# Patient Record
Sex: Male | Born: 1965 | Race: White | Hispanic: No | Marital: Married | Smoking: Never smoker
Health system: Southern US, Community
[De-identification: ages and names within clinical notes are randomized; demographics above are authoritative.]

## PROBLEM LIST (undated history)

## (undated) DIAGNOSIS — I2699 Other pulmonary embolism without acute cor pulmonale: Secondary | ICD-10-CM

## (undated) DIAGNOSIS — I10 Essential (primary) hypertension: Secondary | ICD-10-CM

## (undated) DIAGNOSIS — I82409 Acute embolism and thrombosis of unspecified deep veins of unspecified lower extremity: Secondary | ICD-10-CM

## (undated) HISTORY — PX: BACK SURGERY: SHX140

---

## 2003-07-30 ENCOUNTER — Encounter: Payer: Self-pay | Admitting: Specialist

## 2003-07-30 ENCOUNTER — Encounter: Admission: RE | Admit: 2003-07-30 | Discharge: 2003-07-30 | Payer: Self-pay | Admitting: Specialist

## 2003-08-21 ENCOUNTER — Encounter: Payer: Self-pay | Admitting: Specialist

## 2003-08-22 ENCOUNTER — Inpatient Hospital Stay (HOSPITAL_COMMUNITY): Admission: RE | Admit: 2003-08-22 | Discharge: 2003-08-24 | Payer: Self-pay | Admitting: Specialist

## 2015-11-05 ENCOUNTER — Telehealth: Payer: Self-pay | Admitting: Internal Medicine

## 2015-11-05 ENCOUNTER — Inpatient Hospital Stay (HOSPITAL_COMMUNITY)
Admission: AD | Admit: 2015-11-05 | Discharge: 2015-11-10 | DRG: 176 | Disposition: A | Discharge status: Home / Self Care | Payer: PRIVATE HEALTH INSURANCE | Source: Other Acute Inpatient Hospital | Attending: Internal Medicine | Admitting: Internal Medicine

## 2015-11-05 ENCOUNTER — Encounter (HOSPITAL_COMMUNITY): Payer: Self-pay

## 2015-11-05 DIAGNOSIS — Z6834 Body mass index (BMI) 34.0-34.9, adult: Secondary | ICD-10-CM | POA: Diagnosis not present

## 2015-11-05 DIAGNOSIS — I10 Essential (primary) hypertension: Secondary | ICD-10-CM | POA: Diagnosis present

## 2015-11-05 DIAGNOSIS — I82402 Acute embolism and thrombosis of unspecified deep veins of left lower extremity: Secondary | ICD-10-CM | POA: Diagnosis present

## 2015-11-05 DIAGNOSIS — R55 Syncope and collapse: Secondary | ICD-10-CM | POA: Diagnosis present

## 2015-11-05 DIAGNOSIS — I5189 Other ill-defined heart diseases: Secondary | ICD-10-CM | POA: Diagnosis present

## 2015-11-05 DIAGNOSIS — I824Z2 Acute embolism and thrombosis of unspecified deep veins of left distal lower extremity: Secondary | ICD-10-CM | POA: Diagnosis not present

## 2015-11-05 DIAGNOSIS — R079 Chest pain, unspecified: Secondary | ICD-10-CM

## 2015-11-05 DIAGNOSIS — R000 Tachycardia, unspecified: Secondary | ICD-10-CM | POA: Diagnosis present

## 2015-11-05 DIAGNOSIS — I82539 Chronic embolism and thrombosis of unspecified popliteal vein: Secondary | ICD-10-CM

## 2015-11-05 DIAGNOSIS — I2699 Other pulmonary embolism without acute cor pulmonale: Secondary | ICD-10-CM | POA: Diagnosis present

## 2015-11-05 DIAGNOSIS — Z86711 Personal history of pulmonary embolism: Secondary | ICD-10-CM | POA: Diagnosis not present

## 2015-11-05 DIAGNOSIS — Z8249 Family history of ischemic heart disease and other diseases of the circulatory system: Secondary | ICD-10-CM | POA: Diagnosis not present

## 2015-11-05 DIAGNOSIS — E669 Obesity, unspecified: Secondary | ICD-10-CM | POA: Diagnosis present

## 2015-11-05 DIAGNOSIS — I2609 Other pulmonary embolism with acute cor pulmonale: Secondary | ICD-10-CM | POA: Diagnosis not present

## 2015-11-05 DIAGNOSIS — I82409 Acute embolism and thrombosis of unspecified deep veins of unspecified lower extremity: Secondary | ICD-10-CM | POA: Diagnosis present

## 2015-11-05 HISTORY — DX: Essential (primary) hypertension: I10

## 2015-11-05 LAB — CBC WITH DIFFERENTIAL/PLATELET
Basophils Absolute: 0.1 10*3/uL (ref 0.0–0.1)
Basophils Relative: 1 %
EOS PCT: 1 %
Eosinophils Absolute: 0.1 10*3/uL (ref 0.0–0.7)
HEMATOCRIT: 43.9 % (ref 39.0–52.0)
HEMOGLOBIN: 15.2 g/dL (ref 13.0–17.0)
LYMPHS ABS: 3.1 10*3/uL (ref 0.7–4.0)
LYMPHS PCT: 34 %
MCH: 31 pg (ref 26.0–34.0)
MCHC: 34.6 g/dL (ref 30.0–36.0)
MCV: 89.4 fL (ref 78.0–100.0)
Monocytes Absolute: 0.5 10*3/uL (ref 0.1–1.0)
Monocytes Relative: 5 %
NEUTROS ABS: 5.5 10*3/uL (ref 1.7–7.7)
Neutrophils Relative %: 59 %
Platelets: 142 10*3/uL — ABNORMAL LOW (ref 150–400)
RBC: 4.91 MIL/uL (ref 4.22–5.81)
RDW: 12.2 % (ref 11.5–15.5)
WBC: 9.3 10*3/uL (ref 4.0–10.5)

## 2015-11-05 LAB — COMPREHENSIVE METABOLIC PANEL
ALT: 58 U/L (ref 17–63)
AST: 38 U/L (ref 15–41)
Albumin: 3.5 g/dL (ref 3.5–5.0)
Alkaline Phosphatase: 75 U/L (ref 38–126)
Anion gap: 9 (ref 5–15)
BUN: 17 mg/dL (ref 6–20)
CHLORIDE: 105 mmol/L (ref 101–111)
CO2: 23 mmol/L (ref 22–32)
CREATININE: 1.2 mg/dL (ref 0.61–1.24)
Calcium: 8.4 mg/dL — ABNORMAL LOW (ref 8.9–10.3)
GFR calc non Af Amer: >60 mL/min (ref >60)
Glucose, Bld: 127 mg/dL — ABNORMAL HIGH (ref 65–99)
Potassium: 4.4 mmol/L (ref 3.5–5.1)
SODIUM: 137 mmol/L (ref 135–145)
Total Bilirubin: 1.1 mg/dL (ref 0.3–1.2)
Total Protein: 6.3 g/dL — ABNORMAL LOW (ref 6.5–8.1)

## 2015-11-05 LAB — TROPONIN I: Troponin I: 0.39 ng/mL — ABNORMAL HIGH (ref <0.031)

## 2015-11-05 LAB — HEPARIN LEVEL (UNFRACTIONATED): HEPARIN UNFRACTIONATED: 0.38 [IU]/mL (ref 0.30–0.70)

## 2015-11-05 LAB — MRSA PCR SCREENING: MRSA by PCR: NEGATIVE

## 2015-11-05 LAB — MAGNESIUM: Magnesium: 2.1 mg/dL (ref 1.7–2.4)

## 2015-11-05 LAB — TSH: TSH: 1.036 u[IU]/mL (ref 0.350–4.500)

## 2015-11-05 LAB — LACTIC ACID, PLASMA: Lactic Acid, Venous: 1.4 mmol/L (ref 0.5–2.0)

## 2015-11-05 MED ORDER — HYDRALAZINE HCL 20 MG/ML IJ SOLN: 5.0000 mg | INTRAMUSCULAR | Status: DC | PRN

## 2015-11-05 MED ORDER — HEPARIN (PORCINE) IN NACL 100-0.45 UNIT/ML-% IJ SOLN
1800.0000 [IU]/h | INTRAMUSCULAR | Status: DC
  Administered 2015-11-06: 1800 [IU]/h via INTRAVENOUS
  Administered 2015-11-06: 1600 [IU]/h via INTRAVENOUS
  Administered 2015-11-07: 1800 [IU]/h via INTRAVENOUS
  Filled 2015-11-05 (×3): qty 250

## 2015-11-05 MED ORDER — SODIUM CHLORIDE 0.9 % IJ SOLN
3.0000 mL | Freq: Two times a day (BID) | INTRAMUSCULAR | Status: DC
  Administered 2015-11-05 – 2015-11-08 (×5): 3 mL via INTRAVENOUS

## 2015-11-05 MED ORDER — ONDANSETRON HCL 4 MG PO TABS: 4.0000 mg | ORAL_TABLET | Freq: Four times a day (QID) | ORAL | Status: DC | PRN

## 2015-11-05 MED ORDER — CETYLPYRIDINIUM CHLORIDE 0.05 % MT LIQD
7.0000 mL | Freq: Two times a day (BID) | OROMUCOSAL | Status: DC
  Administered 2015-11-05 – 2015-11-10 (×7): 7 mL via OROMUCOSAL

## 2015-11-05 MED ORDER — ACETAMINOPHEN 325 MG PO TABS: 650.0000 mg | ORAL_TABLET | Freq: Four times a day (QID) | ORAL | Status: DC | PRN

## 2015-11-05 MED ORDER — ONDANSETRON HCL 4 MG/2ML IJ SOLN: 4.0000 mg | Freq: Four times a day (QID) | INTRAMUSCULAR | Status: DC | PRN

## 2015-11-05 MED ORDER — ENSURE ENLIVE PO LIQD
237.0000 mL | Freq: Two times a day (BID) | ORAL | Status: DC
  Administered 2015-11-06 – 2015-11-08 (×5): 237 mL via ORAL

## 2015-11-05 MED ORDER — SODIUM CHLORIDE 0.9 % IJ SOLN
3.0000 mL | Freq: Two times a day (BID) | INTRAMUSCULAR | Status: DC
  Administered 2015-11-06 – 2015-11-08 (×4): 3 mL via INTRAVENOUS

## 2015-11-05 MED ORDER — ACETAMINOPHEN 650 MG RE SUPP: 650.0000 mg | Freq: Four times a day (QID) | RECTAL | Status: DC | PRN

## 2015-11-05 MED ORDER — OXYCODONE HCL 5 MG PO TABS
5.0000 mg | ORAL_TABLET | ORAL | Status: DC | PRN
  Administered 2015-11-06 – 2015-11-07 (×2): 5 mg via ORAL
  Filled 2015-11-05 (×2): qty 1

## 2015-11-05 MED ORDER — SODIUM CHLORIDE 0.45 % IV SOLN
INTRAVENOUS | Status: DC
  Administered 2015-11-05 – 2015-11-08 (×6): via INTRAVENOUS

## 2015-11-05 NOTE — Telephone Encounter (Signed)
[facility] Plan of Care Note  Patient: Randy GlennBrian A Papania    XLK:[id number]  PCP: No primary care provider on file.  DOB: September 12, 1966  DOS: 11/05/2015   Received a phone call from Facility[facility] regarding transfer of Mr. Randy GlennBrian A Nolte. Requesting: Dr. Littie Deedsgentry Reason for transfer: Critical care support History: No past medical history, presents with complains of cough shortness of breath and an episode of syncope. Vitals: Blood pressure 130/70, heart rate 112, 97% on 2 L, 92% on room air, afebrile Labs: Hemoglobin 16, creatinine 1.2 Exam: No edema, unremarkable examination, CT chest shows soft massive PE with right heart strain, troponin 0.19 indeterminate Treatment received: Heparin infusion started. Initially discussed with critical care, and recommendation from them was to [facility] under hospitalist care for stepdown.  Plan of care: The patient will be accepted [facility].  Author: Lynden OxfordPranav Patel, MD [facility] Pager: [phone] 11/05/2015 1:23 PM   If 7PM-7AM, please contact night-coverage at [email or web address], password [email or web address]

## 2015-11-05 NOTE — H&P (Signed)
[facility] History and Physical  Randy [facility] [id number]:[id number] DOB: 1966/06/07 DOA: 11/05/2015  Referring physician: [facility] PCP: No primary care provider on file.   Chief Complaint: PE and syncope  HPI: Randy Hutchinson is a 50 y.o. male  Patient's initial presenting complaint today came after a syncopal episode with progressive shortness of breath. Per report patient has had intermittent shortness of breath since the end of November. This is associated with left lower leg and ankle cramping. Symptoms were significantly worse with ambulation. Patient states that he will get winded walking to his truck which normally doesn't happen. Patient states at baseline he is in excellent health and somewhat athletic. Shortness of breath became very constant over the last couple of weeks. Patient was seen multiple times by different providers who diagnosed him with upper respiratory tract infections he completed 2 separate rounds of antibiotics as well as received 2 separate steroid shots without any significant improvement of symptoms. Patient had a single episode of hemoptysis a couple of days ago. Patient has had significant worsening of her shortness of breath over the last 4 days. Denies any lower extremity swelling. Patient states that he woke this morning in order to go to work and ambulate to the bathroom. He states that the next he remembers he is on the bathroom floor feeling completely confused and then passed out again lying on the floor. Sometime later he woke up again and started banging on the wall at which time his son came in and found him. EMS was called and patient transported to [facility]. Associated w/ mild chest discomfort.   Review of Systems:  Constitutional:  No weight loss, night sweats, Fevers,  HEENT:  No headaches, Difficulty swallowing,Tooth/dental problems,Sore throat, Cardio-vascular:  No Orthopnea, PND, swelling in lower extremities, GI:    No heartburn, indigestion, abdominal pain, nausea, vomiting, diarrhea, change in bowel habits, loss of appetite  Resp: Per HPI Skin:  no rash or lesions.  GU:  no dysuria, change in color of urine, no urgency or frequency. No flank pain.  Musculoskeletal:   No joint pain or swelling. No decreased range of motion. No back pain.  Psych:  No change in mood or affect. No depression or anxiety. No memory loss.  Neuro:  No change in sensation, unilateral strength, or cognitive abilities  All other systems were reviewed and are negative.  Past Medical History  Diagnosis Date  . Essential hypertension 11/05/2015   Past Surgical History  Procedure Laterality Date  . Back surgery      trauma sustained from fall   Social History:  reports that he has never smoked. He does not have any smokeless tobacco history on file. He reports that he drinks alcohol. He reports that he does not use illicit drugs.  No Known Allergies  Family History  Problem Relation Age of Onset  . Cancer Father     lymphoma  . Heart attack Paternal Grandfather      Prior to Admission medications   Not on File   Physical Exam: Filed Vitals:   11/05/15 1600  BP: 148/89  Pulse: 119  Temp: 99.3 F (37.4 C)  TempSrc: Oral  Resp: 20  Height: 6\' 4"  (1.93 m)  Weight: 121.4 kg (267 lb 10.2 oz)  SpO2: 96%    Wt Readings from Last 3 Encounters:  11/05/15 121.4 kg (267 lb 10.2 oz)    General: Appears to be in mild distress Eyes:  PERRL, EOMI, normal lids,  iris ENT:  grossly normal hearing, lips & tongue Neck:  no LAD, masses or thyromegaly Cardiovascular:  RRR, no m/r/g. No LE edema.  Respiratory:  CTA bilaterally, no w/r/r. Normal respiratory effort. Abdomen:  soft, ntnd Skin:  no rash or induration seen on limited exam Musculoskeletal:  grossly normal tone BUE/BLE Psychiatric:  grossly normal mood and affect, speech fluent and appropriate Neurologic:  CN 2-12 grossly intact, moves all extremities in  coordinated fashion.          Labs on Admission:  Basic Metabolic Panel: No results for input(s): NA, K, CL, CO2, GLUCOSE, BUN, CREATININE, CALCIUM, MG, PHOS in the last 168 hours. Liver Function Tests: No results for input(s): AST, ALT, ALKPHOS, BILITOT, PROT, ALBUMIN in the last 168 hours. No results for input(s): LIPASE, AMYLASE in the last 168 hours. No results for input(s): AMMONIA in the last 168 hours. CBC: No results for input(s): WBC, NEUTROABS, HGB, HCT, MCV, PLT in the last 168 hours. Cardiac Enzymes: No results for input(s): CKTOTAL, CKMB, CKMBINDEX, TROPONINI in the last 168 hours.  BNP (last 3 results) No results for input(s): BNP in the last 8760 hours.  ProBNP (last 3 results) No results for input(s): PROBNP in the last 8760 hours.   Creatinine clearance cannot be calculated (No order found.)  CBG: No results for input(s): GLUCAP in the last 168 hours.  Radiological Exams on Admission: No results found.   Assessment/Plan Active Problems:   Acute pulmonary embolism (HCC)   DVT (deep venous thrombosis) (HCC)   Syncope   Essential hypertension   Tachycardia   Pulmonary embolism [facility])  Medical records from [facility] unavailable at time of admission but have been requested to be faxed to [facility] at [phone].  Pulmonary Embolism: Per report patient sustained a massive pulmonary embolus. Suspect from patient's history he likely had multiple small emboli before mobilizing a large thrombus this morning lying related to the bathroom. Patient currently is stable. Suspect this came from his left lower extremity. We'll not re-CTA patient has had the study done this morning but will review records when they arrive from [facility]. 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  - Stepdown - Hep drip - Review [facility] records - Bilat LE dopplers - hypercoag workup after off  anticoagulation in 3-6 mo. - Echo - EKG   Syncope: likely secondary to massive PE. No evidence of stroke or Szr, or metabolic, or infectious etiology. Report pending. Per pt CT of head negative. Low likelihood of ICP. - Tele - CMET, CBC, lactic acid  Hypertension: hypertensive on presentation. Likely from PE - Hydralazine PRN   Code Status: FULL  DVT Prophylaxis: Heparin Drip Family Communication: None Disposition Plan: Pending Improvement    MERRELL, DAVID Shela Commons, MD Family Medicine [facility] [email or web address] Password [email or web address]

## 2015-11-05 NOTE — Progress Notes (Addendum)
ANTICOAGULATION CONSULT NOTE - Initial Consult  Pharmacy Consult for Heparin Indication: pulmonary embolus  No Known Allergies  Patient Measurements: Height: 6\' 4"  (193 cm) Weight: 267 lb 10.2 oz (121.4 kg) IBW/kg (Calculated) : 86.8 Heparin Dosing Weight: 112kg  Vital Signs: Temp: 99.3 F (37.4 C) (01/04 1600) Temp Source: Oral (01/04 1600) BP: 148/89 mmHg (01/04 1600) Pulse Rate: 119 (01/04 1600)  Labs: No results for input(s): HGB, HCT, PLT, APTT, LABPROT, INR, HEPARINUNFRC, CREATININE, CKTOTAL, CKMB, TROPONINI in the last 72 hours.  CrCl cannot be calculated (Patient has no serum creatinine result on file.).   Medical History: Past Medical History  Diagnosis Date  . Essential hypertension 11/05/2015    Assessment: 50 year old male presents with syncopal episode with progressive shortness of breath. Single episode of hemoptysis two days ago.  [facility], bolus unknown but rate is currently at 1600 units/hr started around 2pm. CTA shows at least submassive PE.  Notable labs, hgb 16, plt 123, scr 1.2, INR 1.1   Goal of Therapy:  Heparin level 0.3-0.7 units/ml Monitor platelets by anticoagulation protocol: Yes   Plan:  Continue heparin at 1600 units/hr Check HL at 8pm Daily HL/CBC  Sheppard CoilFrank Wilson PharmD., BCPS Clinical Pharmacist Pager [phone] 11/05/2015 6:40 PM  HL at goal 0.38 on 1600 units/hr. Plan to recheck HL in am  11/05/2015 8:59 PM

## 2015-11-06 ENCOUNTER — Inpatient Hospital Stay (HOSPITAL_COMMUNITY): Payer: PRIVATE HEALTH INSURANCE

## 2015-11-06 DIAGNOSIS — Z86711 Personal history of pulmonary embolism: Secondary | ICD-10-CM

## 2015-11-06 DIAGNOSIS — I2699 Other pulmonary embolism without acute cor pulmonale: Secondary | ICD-10-CM

## 2015-11-06 LAB — COMPREHENSIVE METABOLIC PANEL
ALBUMIN: 2.9 g/dL — AB (ref 3.5–5.0)
ALK PHOS: 66 U/L (ref 38–126)
ALT: 50 U/L (ref 17–63)
AST: 30 U/L (ref 15–41)
Anion gap: 6 (ref 5–15)
BUN: 16 mg/dL (ref 6–20)
CALCIUM: 8.2 mg/dL — AB (ref 8.9–10.3)
CO2: 25 mmol/L (ref 22–32)
CREATININE: 1.1 mg/dL (ref 0.61–1.24)
Chloride: 105 mmol/L (ref 101–111)
GFR calc Af Amer: >60 mL/min (ref >60)
GFR calc non Af Amer: >60 mL/min (ref >60)
GLUCOSE: 103 mg/dL — AB (ref 65–99)
Potassium: 4 mmol/L (ref 3.5–5.1)
SODIUM: 136 mmol/L (ref 135–145)
Total Bilirubin: 1.2 mg/dL (ref 0.3–1.2)
Total Protein: 5.6 g/dL — ABNORMAL LOW (ref 6.5–8.1)

## 2015-11-06 LAB — CBC
HEMATOCRIT: 40.5 % (ref 39.0–52.0)
Hemoglobin: 14.2 g/dL (ref 13.0–17.0)
MCH: 31.8 pg (ref 26.0–34.0)
MCHC: 35.1 g/dL (ref 30.0–36.0)
MCV: 90.8 fL (ref 78.0–100.0)
Platelets: 128 10*3/uL — ABNORMAL LOW (ref 150–400)
RBC: 4.46 MIL/uL (ref 4.22–5.81)
RDW: 12.2 % (ref 11.5–15.5)
WBC: 9.9 10*3/uL (ref 4.0–10.5)

## 2015-11-06 LAB — TROPONIN I
Troponin I: 0.33 ng/mL — ABNORMAL HIGH (ref <0.031)
Troponin I: 0.17 ng/mL — ABNORMAL HIGH (ref <0.031)

## 2015-11-06 LAB — HEPARIN LEVEL (UNFRACTIONATED)
HEPARIN UNFRACTIONATED: 0.2 [IU]/mL — AB (ref 0.30–0.70)
HEPARIN UNFRACTIONATED: 0.49 [IU]/mL (ref 0.30–0.70)

## 2015-11-06 LAB — GLUCOSE, CAPILLARY: GLUCOSE-CAPILLARY: 91 mg/dL (ref 65–99)

## 2015-11-06 MED ORDER — HEPARIN BOLUS VIA INFUSION
2000.0000 [IU] | Freq: Once | INTRAVENOUS | Status: AC
  Administered 2015-11-06: 2000 [IU] via INTRAVENOUS
  Filled 2015-11-06: qty 2000

## 2015-11-06 MED ORDER — PERFLUTREN LIPID MICROSPHERE
1.0000 mL | INTRAVENOUS | Status: AC | PRN
  Filled 2015-11-06: qty 10

## 2015-11-06 NOTE — Progress Notes (Signed)
Dr Joseph ArtWoods made aware of positive DVT Right lower extremity, patient on heparin drip.  Corliss SkainsJuan Eleyna Brugh RN

## 2015-11-06 NOTE — Progress Notes (Signed)
[place] TEAM 1 - Stepdown/ICU TEAM Progress Note  Randy Hutchinson ZOX:[id number] DOB: 1966/07/25 DOA: 11/05/2015 PCP: No primary care provider on file.  Admit HPI / Brief Narrative: 50 y.o. WM PMHx HTN   Patient's initial presenting complaint today came after a syncopal episode with progressive shortness of breath. Per report patient has had intermittent shortness of breath since the end of November. This is associated with left lower leg and ankle cramping. Symptoms were significantly worse with ambulation. Patient states that he will get winded walking to his truck which normally doesn't happen. Patient states at baseline he is in excellent health and somewhat athletic. Shortness of breath became very constant over the last couple of weeks. Patient was seen multiple times by different providers who diagnosed him with upper respiratory tract infections he completed 2 separate rounds of antibiotics as well as received 2 separate steroid shots without any significant improvement of symptoms. Patient had a single episode of hemoptysis a couple of days ago. Patient has had significant worsening of her shortness of breath over the last 4 days. Denies any lower extremity swelling. Patient states that he woke this morning in order to go to work and ambulate to the bathroom. He states that the next he remembers he is on the bathroom floor feeling completely confused and then passed out again lying on the floor. Sometime later he woke up again and started banging on the wall at which time his son came in and found him. EMS was called and patient transported to [facility]. Associated w/ mild chest discomfort.    HPI/Subjective: 1/5 A/O 4, positive painful cough. States had been having what he thought was bronchitis for several weeks. Also was having left calf pain but associated with his long drives for work during the day. 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 No family members with PE/DVT    Assessment/Plan: Pulmonary Embolism/Left Lower Extremity DVT: - Per report patient sustained a massive pulmonary embolus. Suspect from patient's history he likely had multiple small emboli before mobilizing a large thrombus this morning lying related to the bathroom. Patient currently is stable. Suspect this came from his left lower extremity. We'll not re-CTA patient has had the study done this morning but will review records when they arrive [facility]. 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  - Hep drip -Awaiting [facility] hospital records - Bilat LE dopplers; positive LLE DVT C results below - hypercoag workup after off anticoagulation in 3-6 mo. -Long discussion with patient and wife concerning choice of anticoagulant. 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 Patient and wife to talk it over  Right heart strain/Diastolic Dysfunction -See echocardiogram below  Syncope:  -likely secondary to massive PE. No evidence of stroke or Szr, or metabolic, or infectious etiology. Report pending. Per pt CT of head negative. Low likelihood of ICP.  Hypertension:  -Currently controlled: Continue  Hydralazine PRN    Code Status: FULL Family Communication: Wife present at time of exam Disposition Plan: After patient chooses anticoagulation modality    Consultants:   Procedure/Significant Events: 1/5 echocardiogram;- Left ventricle: mild LVH. -LVEF= 55% to 60%.-(grade 1 diastolic dysfunction). - septalflattening suggestive of elevated pulmonary pressure - Right ventricle: moderate RV dysfunction.. 1/5 bilateral lower extremity Doppler; subacute DVT noted in the left popliteal  and posterior tibial veins   Culture NA  Antibiotics: NA  DVT prophylaxis: Heparin  drip   Devices NA   LINES / TUBES:  NA    Continuous Infusions: . sodium chloride 75 mL/hr at 11/06/15 0713  . heparin 1,800 Units/hr (11/06/15 1848)    Objective: VITAL SIGNS: Temp: 97.3 F (36.3 C) (01/05 1600) Temp Source: Oral (01/05 1600) BP: 132/102 mmHg (01/05 1900) Pulse Rate: 84 (01/05 1900) SPO2; FIO2:   Intake/Output Summary (Last 24 hours) at 11/06/15 1933 Last data filed at 11/06/15 1900  Gross per 24 hour  Intake 3127.6 ml  Output   1700 ml  Net 1427.6 ml     Exam: General: A/O 4, positive painful cough,No acute respiratory distress Eyes: Negative headache, eye pain, double vision,negative scleral hemorrhage ENT: Negative Runny nose, negative ear pain, negative gingival bleeding, Neck:  Negative scars, masses, torticollis, lymphadenopathy, JVD Lungs: Clear to auscultation bilaterally without wheezes or crackles Cardiovascular: Regular rate and rhythm without murmur gallop or rub normal S1 and S2 Abdomen:negative abdominal pain, nondistended, positive soft, bowel sounds, no rebound, no ascites, no appreciable mass Extremities: No significant cyanosis, clubbing, or edema bilateral lower extremities Psychiatric:  Negative depression, negative anxiety, negative fatigue, negative mania  Neurologic:  Cranial nerves II through XII intact, tongue/uvula midline, all extremities muscle strength 5/5, sensation intact throughout, negative dysarthria, negative expressive aphasia, negative receptive aphasia.   Data Reviewed: Basic Metabolic Panel:  Recent Labs Lab 11/05/15 2012 11/06/15 0810  NA 137 136  K 4.4 4.0  CL 105 105  CO2 23 25  GLUCOSE 127* 103*  BUN 17 16  CREATININE 1.20 1.10  CALCIUM 8.4* 8.2*  MG 2.1  --    Liver Function Tests:  Recent Labs Lab 11/05/15 2012 11/06/15 0810  AST 38 30  ALT 58 50  ALKPHOS 75 66  BILITOT 1.1 1.2  PROT 6.3* 5.6*  ALBUMIN 3.5 2.9*   No results for input(s): LIPASE, AMYLASE in the last 168  hours. No results for input(s): AMMONIA in the last 168 hours. CBC:  Recent Labs Lab 11/05/15 2012 11/06/15 0536  WBC 9.3 9.9  NEUTROABS 5.5  --   HGB 15.2 14.2  HCT 43.9 40.5  MCV 89.4 90.8  PLT 142* 128*   Cardiac Enzymes:  Recent Labs Lab 11/05/15 2012 11/06/15 0050 11/06/15 0810  TROPONINI 0.39* 0.33* 0.17*   BNP (last 3 results) No results for input(s): BNP in the last 8760 hours.  ProBNP (last 3 results) No results for input(s): PROBNP in the last 8760 hours.  CBG:  Recent Labs Lab 11/06/15 0508  GLUCAP 91    Recent Results (from the past 240 hour(s))  MRSA PCR Screening     Status: None   Collection Time: 11/05/15  4:50 PM  Result Value Ref Range Status   MRSA by PCR NEGATIVE NEGATIVE Final    Comment:        The GeneXpert MRSA Assay (FDA approved for NASAL specimens only), is one component of a comprehensive MRSA colonization surveillance program. It is not intended to diagnose MRSA infection nor to guide or monitor treatment for MRSA infections.      Studies:  Recent x-ray studies have been reviewed in detail by the Attending Physician  Scheduled Meds:  Scheduled Meds: . antiseptic oral rinse  7 mL Mouth Rinse BID  . feeding supplement (ENSURE ENLIVE)  237 mL Oral BID BM  . sodium chloride  3 mL Intravenous Q12H  . sodium chloride  3 mL Intravenous Q12H    Time spent on care of this patient: 40 mins   WOODS, Roselind Messier , MD  [facility] Office  [phone] Pager [phone]  On-Call/Text Page:      [email or web address]      password [email or web address]  If 7PM-7AM, please contact night-coverage [email or web address] Password [email or web address] 11/06/2015, 7:33 PM   LOS: 1 day   Care during the described time interval was provided by me .  I have reviewed this patient's available data, including medical history, events of note, physical examination, and all test results as part of my evaluation. I have personally reviewed and interpreted all radiology  studies.   Carolyne Littles, MD [phone] Pager

## 2015-11-06 NOTE — Progress Notes (Signed)
ANTICOAGULATION CONSULT NOTE - Follow Up Consult  Pharmacy Consult for Heparin  Indication: pulmonary embolus  No Known Allergies  Patient Measurements: Height: 6\' 4"  (193 cm) Weight: 271 lb 6.2 oz (123.1 kg) IBW/kg (Calculated) : 86.8  Vital Signs: Temp: 98.9 F (37.2 C) (01/05 0402) Temp Source: Oral (01/05 0402) BP: 110/86 mmHg (01/05 0402) Pulse Rate: 80 (01/05 0402)  Labs:  Recent Labs  11/05/15 2012 11/06/15 0050 11/06/15 0536  HGB 15.2  --  14.2  HCT 43.9  --  40.5  PLT 142*  --  128*  HEPARINUNFRC 0.38  --  0.20*  CREATININE 1.20  --   --   TROPONINI 0.39* 0.33*  --     Estimated Creatinine Clearance: 106.7 mL/min (by C-G formula based on Cr of 1.2).   Assessment: New onset PE, sub-therapeutic heparin level, no issues per RN.   Goal of Therapy:  Heparin level 0.3-0.7 units/ml Monitor platelets by anticoagulation protocol: Yes   Plan:  -Heparin 2000 units BOLUS -Increase heparin drip to 1800 units/hr -1400 HL -Daily CBC/HL -Monitor for bleeding  [facility], [facility] 11/06/2015,6:58 AM

## 2015-11-06 NOTE — Care Management Note (Signed)
Case Management Note  Patient Details  Name: Maxine GlennBrian A Beale MRN: [id number] Date of Birth: Nov 05, 1965  Subjective/Objective:    Pt has no PCP, has been going to urgent care, now wants to establish with physician in [place] or [place].  Pt is listed as self-pay but has insurance.  Wife will take insurance card to admitting to get insurance into system.  Provided number for [employer], wife will call to obtain names of physicians accepting new pts and will call one for appointment.                          Expected Discharge Plan:  Home/Self Care  Discharge planning Services  CM Consult  Status of Service:  In process, will continue to follow  Magdalene RiverMayo, Julianna Vanwagner T, RN 11/06/2015, 10:50 AM

## 2015-11-06 NOTE — Progress Notes (Signed)
  Echocardiogram 2D Echocardiogram with Definity has been performed.  Randy SavoyCasey N Cyrene [facility] 11/06/2015, 9:24 AM

## 2015-11-06 NOTE — Progress Notes (Signed)
VASCULAR LAB PRELIMINARY  PRELIMINARY  PRELIMINARY  PRELIMINARY  Bilateral lower extremity venous duplex completed.    Preliminary report:  There is subacute DVT noted in the left popliteal and posterior tibial veins.  All other veins appear thrombus free.    Donielle Radziewicz, RVT 11/06/2015, 10:26 AM

## 2015-11-06 NOTE — Progress Notes (Signed)
Nutrition Brief Note  Patient identified on the Malnutrition Screening Tool (MST) Report.  Wt Readings from Last 15 Encounters:  11/06/15 271 lb 6.2 oz (123.1 kg)    Body mass index is 33.05 kg/(m^2). Patient meets criteria for Obesity Class II based on current BMI.   Current diet order is Heart Healthy, patient is consuming approximately 100% of meals at this time. Labs and medications reviewed.   No nutrition interventions warranted at this time. If nutrition issues arise, please consult RD.   Maureen ChattersKatie Fidel Caggiano, RD, LDN Pager #: [phone] After-Hours Pager #: [phone]

## 2015-11-06 NOTE — Progress Notes (Signed)
ANTICOAGULATION CONSULT NOTE - Initial Consult  Pharmacy Consult for Heparin Indication: pulmonary embolus  No Known Allergies  Patient Measurements: Height: 6\' 4"  (193 cm) Weight: 271 lb 6.2 oz (123.1 kg) IBW/kg (Calculated) : 86.8 Heparin Dosing Weight: 112kg  Vital Signs: Temp: 97.3 F (36.3 C) (01/05 1600) Temp Source: Oral (01/05 1600) BP: 116/80 mmHg (01/05 1800) Pulse Rate: 81 (01/05 1800)  Labs:  Recent Labs  11/05/15 2012 11/06/15 0050 11/06/15 0536 11/06/15 0810 11/06/15 1530  HGB 15.2  --  14.2  --   --   HCT 43.9  --  40.5  --   --   PLT 142*  --  128*  --   --   HEPARINUNFRC 0.38  --  0.20*  --  0.49  CREATININE 1.20  --   --  1.10  --   TROPONINI 0.39* 0.33*  --  0.17*  --     Estimated Creatinine Clearance: 116.4 mL/min (by C-G formula based on Cr of 1.1).   Medical History: Past Medical History  Diagnosis Date  . Essential hypertension 11/05/2015    Assessment: 50 year old male on IV heparin for PE, heparin level = 0.49, therapeutic after rate was increased to 1800 units/hr, hgb 14.2, plt 128K. subacute DVT noted in the left popliteal and posterior tibial veins.    Goal of Therapy:  Heparin level 0.3-0.7 units/ml Monitor platelets by anticoagulation protocol: Yes   Plan:  Continue heparin at 1800 units/hr Recheck heparin level in AM Daily HL/CBC  Bayard HuggerMei Ofilia Rayon, PharmD, BCPS  Clinical Pharmacist  Pager: [phone]   11/06/2015 6:26 PM

## 2015-11-07 DIAGNOSIS — I824Z2 Acute embolism and thrombosis of unspecified deep veins of left distal lower extremity: Secondary | ICD-10-CM

## 2015-11-07 DIAGNOSIS — I2609 Other pulmonary embolism with acute cor pulmonale: Secondary | ICD-10-CM

## 2015-11-07 DIAGNOSIS — R000 Tachycardia, unspecified: Secondary | ICD-10-CM

## 2015-11-07 LAB — CBC
HEMATOCRIT: 40 % (ref 39.0–52.0)
HEMOGLOBIN: 13.4 g/dL (ref 13.0–17.0)
MCH: 30.3 pg (ref 26.0–34.0)
MCHC: 33.5 g/dL (ref 30.0–36.0)
MCV: 90.5 fL (ref 78.0–100.0)
Platelets: 150 10*3/uL (ref 150–400)
RBC: 4.42 MIL/uL (ref 4.22–5.81)
RDW: 12 % (ref 11.5–15.5)
WBC: 9.7 10*3/uL (ref 4.0–10.5)

## 2015-11-07 LAB — GLUCOSE, CAPILLARY: Glucose-Capillary: 86 mg/dL (ref 65–99)

## 2015-11-07 LAB — HEPARIN LEVEL (UNFRACTIONATED): HEPARIN UNFRACTIONATED: 0.33 [IU]/mL (ref 0.30–0.70)

## 2015-11-07 MED ORDER — HEPARIN (PORCINE) IN NACL 100-0.45 UNIT/ML-% IJ SOLN
2150.0000 [IU]/h | INTRAMUSCULAR | Status: DC
  Administered 2015-11-07: 1900 [IU]/h via INTRAVENOUS
  Administered 2015-11-08: 2000 [IU]/h via INTRAVENOUS
  Administered 2015-11-09: 2300 [IU]/h via INTRAVENOUS
  Filled 2015-11-07 (×4): qty 250

## 2015-11-07 MED FILL — Perflutren Lipid Microsphere IV Susp 1.1 MG/ML: INTRAVENOUS | Qty: 10 | Status: AC

## 2015-11-07 NOTE — Progress Notes (Signed)
Pt with new onset back pain that radiates to chest. Pt also appears to be SOB at this time. EKG performed per chest pain protocol and PRN analgesic administered. NP on call notified. VSS and will continue to monitor.

## 2015-11-07 NOTE — Progress Notes (Signed)
[place] TEAM 1 - Stepdown/ICU TEAM PROGRESS NOTE  Randy Hutchinson ZOX:[id number] DOB: [birth year] DOA: 11/05/2015 PCP: No primary care provider on file.  Admit HPI / Brief Narrative: 50 yo M Hx HTN who presented after a syncopal episode with c/o intermittent shortness of breath since the end of November associated with left lower leg and ankle cramping significantly worse with ambulation. Patient had a single episode of hemoptysis. The morning of his admission he had to lay down on the bathroom floor as he was feeling completely confused and then passed out.   HPI/Subjective: The patient is resting comfortably in bed.  He is anxious to get up out of bed.  He is surprised that he remains somewhat dyspneic even at rest.  He denies significant chest pressure or pain today.  He denies nausea vomiting headache fevers or chills.  Assessment/Plan:  Pulmonary Embolism / Left Lower Extremity DVT -DVT confirmed in L popliteal and posterior tibial veins - this appears to been an unprovoked event with no significant contributing risk factors - patient will require hypercoagulable workup once he has completed an initial one year of treatment - after lengthy discussion today he has chosen Pradaxa for his long-term treatment assuming it is affordable after his insurance coverage  Right heart strain / Diastolic Dysfunction -Mild to moderate - advance activity and follow  Syncope -secondary to PE  Hypertension -Currently controlled  Obesity - Body mass index is 33.99 kg/(m^2).  Code Status: FULL Family Communication: no family present at time of exam Disposition Plan: SDU   Consultants: none  Procedures: 1/5 TTE - mild LVH - EF 55-60% - no WMA - grade 1 DD - RV systolic fxn moderately reduced  1/5 bilateral lower extremity Doppler subacute DVT noted in the left popliteal and posterior tibial veins  Antibiotics: none  DVT prophylaxis: IV heparin  Objective: Blood pressure 115/78, pulse  70, temperature 97.4 F (36.3 C), temperature source Oral, resp. rate 24, height 6\' 4"  (1.93 m), weight 126.6 kg (279 lb 1.6 oz), SpO2 93 %.  Intake/Output Summary (Last 24 hours) at 11/07/15 1142 Last data filed at 11/07/15 1000  Gross per 24 hour  Intake   2600 ml  Output   3425 ml  Net   -825 ml   Exam: General: No acute respiratory distress Lungs: Clear to auscultation bilaterally without wheezes or crackles Cardiovascular: Regular rate and rhythm without murmur gallop or rub normal S1 and S2 Abdomen: Nontender, nondistended, soft, bowel sounds positive, no rebound, no ascites, no appreciable mass Extremities: No significant cyanosis, clubbing, or edema bilateral lower extremities  Data Reviewed:  Basic Metabolic Panel:  Recent Labs Lab 11/05/15 2012 11/06/15 0810  NA 137 136  K 4.4 4.0  CL 105 105  CO2 23 25  GLUCOSE 127* 103*  BUN 17 16  CREATININE 1.20 1.10  CALCIUM 8.4* 8.2*  MG 2.1  --     CBC:  Recent Labs Lab 11/05/15 2012 11/06/15 0536 11/07/15 0412  WBC 9.3 9.9 9.7  NEUTROABS 5.5  --   --   HGB 15.2 14.2 13.4  HCT 43.9 40.5 40.0  MCV 89.4 90.8 90.5  PLT 142* 128* 150    Liver Function Tests:  Recent Labs Lab 11/05/15 2012 11/06/15 0810  AST 38 30  ALT 58 50  ALKPHOS 75 66  BILITOT 1.1 1.2  PROT 6.3* 5.6*  ALBUMIN 3.5 2.9*   Cardiac Enzymes:  Recent Labs Lab 11/05/15 2012 11/06/15 0050 11/06/15 0810  TROPONINI 0.39*  0.33* 0.17*    CBG:  Recent Labs Lab 11/06/15 0508 11/07/15 0808  GLUCAP 91 86    Recent Results (from the past 240 hour(s))  MRSA PCR Screening     Status: None   Collection Time: 11/05/15  4:50 PM  Result Value Ref Range Status   MRSA by PCR NEGATIVE NEGATIVE Final    Comment:        The GeneXpert MRSA Assay (FDA approved for NASAL specimens only), is one component of a comprehensive MRSA colonization surveillance program. It is not intended to diagnose MRSA infection nor to guide or monitor  treatment for MRSA infections.      Studies:   Recent x-ray studies have been reviewed in detail by the Attending Physician  Scheduled Meds:  Scheduled Meds: . antiseptic oral rinse  7 mL Mouth Rinse BID  . feeding supplement (ENSURE ENLIVE)  237 mL Oral BID BM  . sodium chloride  3 mL Intravenous Q12H  . sodium chloride  3 mL Intravenous Q12H    Time spent on care of this patient: 35 mins   Aj Crunkleton T , MD   [facility] Office  [phone] Pager - Text Page per Loretha Stapler as per below:  On-Call/Text Page:      [email or web address]      password [email or web address]  If 7PM-7AM, please contact night-coverage [email or web address] Password [email or web address] 11/07/2015, 11:42 AM   LOS: 2 days

## 2015-11-07 NOTE — Progress Notes (Signed)
Utilization Review Completed.Randy Hutchinson T1/04/2016  

## 2015-11-07 NOTE — Progress Notes (Signed)
ANTICOAGULATION CONSULT NOTE - FOLLOW UP  Pharmacy Consult:  Heparin Indication: pulmonary embolus  No Known Allergies  Patient Measurements: Height: 6\' 4"  (193 cm) Weight: 279 lb 1.6 oz (126.6 kg) IBW/kg (Calculated) : 86.8 Heparin Dosing Weight: 112kg  Vital Signs: Temp: 97.4 F (36.3 C) (01/06 0700) Temp Source: Oral (01/06 0700) BP: 115/78 mmHg (01/06 0700) Pulse Rate: 70 (01/06 0700)  Labs:  Recent Labs  11/05/15 2012 11/06/15 0050 11/06/15 0536 11/06/15 0810 11/06/15 1530 11/07/15 0412  HGB 15.2  --  14.2  --   --  13.4  HCT 43.9  --  40.5  --   --  40.0  PLT 142*  --  128*  --   --  150  HEPARINUNFRC 0.38  --  0.20*  --  0.49 0.33  CREATININE 1.20  --   --  1.10  --   --   TROPONINI 0.39* 0.33*  --  0.17*  --   --     Estimated Creatinine Clearance: 118 mL/min (by C-G formula based on Cr of 1.1).   Assessment: 49YOM continues on IV heparin for PE and subacute DVT noted in the left popliteal and posterior tibial veins.  Heparin level remains therapeutic but has trending down toward the lower range.  No bleeding reported.   Goal of Therapy:  Heparin level 0.3-0.7 units/ml Monitor platelets by anticoagulation protocol: Yes    Plan:  - Increase heparin gtt to 1900 units/hr - Daily HL / CBC - F/U long-term anticoagulation plan    Dali Kraner D. Laney Potashang, PharmD, BCPS Pager:  [phone] 11/07/2015, 10:54 AM

## 2015-11-08 DIAGNOSIS — R55 Syncope and collapse: Secondary | ICD-10-CM

## 2015-11-08 DIAGNOSIS — I10 Essential (primary) hypertension: Secondary | ICD-10-CM

## 2015-11-08 LAB — CBC
HEMATOCRIT: 39.8 % (ref 39.0–52.0)
HEMOGLOBIN: 13.7 g/dL (ref 13.0–17.0)
MCH: 30.6 pg (ref 26.0–34.0)
MCHC: 34.4 g/dL (ref 30.0–36.0)
MCV: 89 fL (ref 78.0–100.0)
Platelets: 181 10*3/uL (ref 150–400)
RBC: 4.47 MIL/uL (ref 4.22–5.81)
RDW: 11.9 % (ref 11.5–15.5)
WBC: 7.8 10*3/uL (ref 4.0–10.5)

## 2015-11-08 LAB — HEPARIN LEVEL (UNFRACTIONATED): Heparin Unfractionated: 0.35 IU/mL (ref 0.30–0.70)

## 2015-11-08 LAB — GLUCOSE, CAPILLARY: GLUCOSE-CAPILLARY: 92 mg/dL (ref 65–99)

## 2015-11-08 MED ORDER — TRAMADOL HCL 50 MG PO TABS: 50.0000 mg | ORAL_TABLET | Freq: Four times a day (QID) | ORAL | Status: DC | PRN

## 2015-11-08 MED ORDER — OXYCODONE HCL 5 MG PO TABS: 5.0000 mg | ORAL_TABLET | ORAL | Status: DC | PRN

## 2015-11-08 NOTE — Progress Notes (Signed)
ANTICOAGULATION CONSULT NOTE - FOLLOW UP  Pharmacy Consult:  Heparin Indication: pulmonary embolus  No Known Allergies  Patient Measurements: Height: 6\' 4"  (193 cm) Weight: 280 lb 3.3 oz (127.1 kg) IBW/kg (Calculated) : 86.8 Heparin Dosing Weight: 112kg  Vital Signs: Temp: 97.3 F (36.3 C) (01/07 0442) Temp Source: Oral (01/07 0442) BP: 114/73 mmHg (01/07 0442) Pulse Rate: 72 (01/07 0442)  Labs:  Recent Labs  11/05/15 2012 11/06/15 0050 11/06/15 0536 11/06/15 0810 11/06/15 1530 11/07/15 0412 11/08/15 0240  HGB 15.2  --  14.2  --   --  13.4 13.7  HCT 43.9  --  40.5  --   --  40.0 39.8  PLT 142*  --  128*  --   --  150 181  HEPARINUNFRC 0.38  --  0.20*  --  0.49 0.33 0.35  CREATININE 1.20  --   --  1.10  --   --   --   TROPONINI 0.39* 0.33*  --  0.17*  --   --   --     Estimated Creatinine Clearance: 118.2 mL/min (by C-G formula based on Cr of 1.1).  Assessment: 49YOM continues on IV heparin for PE and subacute DVT noted in the left popliteal and posterior tibial veins.  Heparin level remains therapeutic (0.35) but is on the lower end.  He has no noted bleeding complications.   Noted plans to start him on long term anticoagulation with dabigatran pending insurance verification.   Goal of Therapy:  Heparin level 0.3-0.7 units/ml Monitor platelets by anticoagulation protocol: Yes    Plan:  - Increase heparin gtt to 2000 units/hr - Daily HL / CBC  Nadara MustardNita Michelene Keniston, PharmD., MS Clinical Pharmacist Pager:  [phone] Thank you for allowing pharmacy to be part of this patients care team. 11/08/2015, 7:54 AM

## 2015-11-08 NOTE — Progress Notes (Signed)
Randy Hutchinson - Stepdown/ICU TEAM PROGRESS NOTE  Randy Hutchinson ZOX:[id number] DOB: 10-04-1966 DOA: Hutchinson/01/2016 PCP: No primary care provider on file.  Admit HPI / Brief Narrative: 50 yo M Hx HTN who presented after a syncopal episode with c/o intermittent shortness of breath since the end of November associated with left lower leg and ankle cramping significantly worse with ambulation. Patient had a single episode of hemoptysis. The morning of his admission he had to lay down on the bathroom floor as he was feeling completely confused and then passed out.   HPI/Subjective: Pt states he is somewhat less short of breath this morning.  He denies current chest pain nausea vomiting or abdominal pain.  He does feel weak in general and somewhat tired.  Assessment/Plan:  Pulmonary Embolism / Left Lower Extremity DVT -DVT confirmed in L popliteal and posterior tibial veins - this appears to been an unprovoked event with no significant contributing risk factors - patient will require hypercoagulable workup once he has completed an initial one year of treatment - after lengthy discussion today he has chosen Pradaxa for his long-term treatment assuming it is affordable after his insurance coverage - awaiting info from CM on preauth and copay - will require 5 days of heparin tx prior to starting pradaxa (appears heparin was started Hutchinson/4 at 18:45)  Right heart strain / Diastolic Dysfunction -Mild to moderate - advance activity and follow - clinically stable at this time   Syncope -secondary to PE  Hypertension -Currently controlled  Obesity - Body mass index is 34.12 kg/(m^2).  Code Status: FULL Family Communication: lengthy discussion w/ pt and wife, including discussion on anticoag options, pros/cons each option, need for eventual hypercoag eval, and need for at least Hutchinson year of anticoag tx  Disposition Plan: transfer to tele bed today - begin to ambulate - wean O2 as able    Consultants: none  Procedures: Hutchinson/5 TTE - mild LVH - EF 55-60% - no WMA - grade Hutchinson DD - RV systolic fxn moderately reduced  Hutchinson/5 bilateral lower extremity Doppler subacute DVT noted in the left popliteal and posterior tibial veins  Antibiotics: none  DVT prophylaxis: IV heparin  Objective: Blood pressure 129/76, pulse 63, temperature 98 F (36.7 C), temperature source Axillary, resp. rate 19, height 6\' 4"  (Hutchinson.93 m), weight 127.Hutchinson kg (280 lb 3.3 oz), SpO2 93 %.  Intake/Output Summary (Last 24 hours) at 11/08/15 1037 Last data filed at 11/08/15 0848  Gross per 24 hour  Intake   1552 ml  Output   3775 ml  Net  -2223 ml   Exam: General: No acute respiratory distress - alert  Lungs: Clear to auscultation bilaterally  Cardiovascular: Regular rate and rhythm without murmur gallop or rub  Abdomen: Nontender, nondistended, soft, bowel sounds positive, no rebound, no ascites, no appreciable mass Extremities: No significant cyanosis, clubbing, edema bilateral lower extremities  Data Reviewed:  Basic Metabolic Panel:  Recent Labs Lab 11/05/15 2012 11/06/15 0810  NA 137 136  K 4.4 4.0  CL 105 105  CO2 23 25  GLUCOSE 127* 103*  BUN 17 16  CREATININE Hutchinson.20 Hutchinson.10  CALCIUM 8.4* 8.2*  MG 2.Hutchinson  --     CBC:  Recent Labs Lab 11/05/15 2012 11/06/15 0536 11/07/15 0412 11/08/15 0240  WBC 9.3 9.9 9.7 7.8  NEUTROABS 5.5  --   --   --   HGB 15.2 14.2 13.4 13.7  HCT 43.9 40.5 40.0 39.8  MCV 89.4 90.8 90.5 89.0  PLT 142* 128* 150 181    Liver Function Tests:  Recent Labs Lab 11/05/15 2012 11/06/15 0810  AST 38 30  ALT 58 50  ALKPHOS 75 66  BILITOT Hutchinson.Hutchinson Hutchinson.2  PROT 6.3* 5.6*  ALBUMIN 3.5 2.9*   Cardiac Enzymes:  Recent Labs Lab 11/05/15 2012 11/06/15 0050 11/06/15 0810  TROPONINI 0.39* 0.33* 0.17*    CBG:  Recent Labs Lab 11/06/15 0508 11/07/15 0808 11/08/15 0834  GLUCAP 91 86 92    Recent Results (from the past 240 hour(s))  MRSA PCR Screening      Status: None   Collection Time: 11/05/15  4:50 PM  Result Value Ref Range Status   MRSA by PCR NEGATIVE NEGATIVE Final    Comment:        The GeneXpert MRSA Assay (FDA approved for NASAL specimens only), is one component of a comprehensive MRSA colonization surveillance program. It is not intended to diagnose MRSA infection nor to guide or monitor treatment for MRSA infections.      Studies:   Recent x-ray studies have been reviewed in detail by the Attending Physician  Scheduled Meds:  Scheduled Meds: . antiseptic oral rinse  7 mL Mouth Rinse BID  . feeding supplement (ENSURE ENLIVE)  237 mL Oral BID BM  . sodium chloride  3 mL Intravenous Q12H  . sodium chloride  3 mL Intravenous Q12H    Time spent on care of this patient: 35 mins   Aashish Hamm T , MD   [facility] Office  [phone] Pager - Text Page per Loretha StaplerAmion as per below:  On-Call/Text Page:      [email or web address]      password [email or web address]  If 7PM-7AM, please contact night-coverage [email or web address] Password [email or web address] Hutchinson/05/2016, 10:37 AM   LOS: 3 days

## 2015-11-09 LAB — CBC
HCT: 41.9 % (ref 39.0–52.0)
HEMOGLOBIN: 14.6 g/dL (ref 13.0–17.0)
MCH: 31.3 pg (ref 26.0–34.0)
MCHC: 34.8 g/dL (ref 30.0–36.0)
MCV: 89.7 fL (ref 78.0–100.0)
PLATELETS: 196 10*3/uL (ref 150–400)
RBC: 4.67 MIL/uL (ref 4.22–5.81)
RDW: 11.9 % (ref 11.5–15.5)
WBC: 9.2 10*3/uL (ref 4.0–10.5)

## 2015-11-09 LAB — HEPARIN LEVEL (UNFRACTIONATED)
HEPARIN UNFRACTIONATED: 0.2 [IU]/mL — AB (ref 0.30–0.70)
Heparin Unfractionated: 0.86 IU/mL — ABNORMAL HIGH (ref 0.30–0.70)

## 2015-11-09 MED ORDER — SODIUM CHLORIDE 0.9 % IV SOLN
INTRAVENOUS | Status: DC
  Administered 2015-11-09: 12:00:00 via INTRAVENOUS

## 2015-11-09 MED ORDER — HEPARIN BOLUS VIA INFUSION
3000.0000 [IU] | Freq: Once | INTRAVENOUS | Status: AC
  Administered 2015-11-09: 3000 [IU] via INTRAVENOUS
  Filled 2015-11-09: qty 3000

## 2015-11-09 MED ORDER — ENOXAPARIN SODIUM 120 MG/0.8ML ~~LOC~~ SOLN
120.0000 mg | SUBCUTANEOUS | Status: AC
  Administered 2015-11-09: 120 mg via SUBCUTANEOUS
  Filled 2015-11-09: qty 0.8

## 2015-11-09 MED ORDER — ENSURE ENLIVE PO LIQD: 237.0000 mL | Freq: Two times a day (BID) | ORAL | Status: DC | PRN

## 2015-11-09 MED ORDER — ENOXAPARIN SODIUM 120 MG/0.8ML ~~LOC~~ SOLN
120.0000 mg | Freq: Two times a day (BID) | SUBCUTANEOUS | Status: DC
  Administered 2015-11-10: 120 mg via SUBCUTANEOUS
  Filled 2015-11-09: qty 0.8

## 2015-11-09 MED ORDER — BENZONATATE 100 MG PO CAPS
200.0000 mg | ORAL_CAPSULE | Freq: Three times a day (TID) | ORAL | Status: DC | PRN
  Administered 2015-11-09: 200 mg via ORAL
  Filled 2015-11-09: qty 2

## 2015-11-09 NOTE — Progress Notes (Signed)
ANTICOAGULATION CONSULT NOTE  Pharmacy Consult:  Heparin Indication: pulmonary embolus  No Known Allergies  Patient Measurements: Height: 6\' 4"  (193 cm) Weight: 279 lb 11.2 oz (126.871 kg) IBW/kg (Calculated) : 86.8 Heparin Dosing Weight: 112kg  Vital Signs: Temp: 98.2 F (36.8 C) (01/08 0427) Temp Source: Oral (01/08 0427) BP: 115/77 mmHg (01/08 0427) Pulse Rate: 73 (01/08 0427)  Labs:  Recent Labs  11/06/15 0810  11/07/15 0412 11/08/15 0240 11/09/15 0305  HGB  --   --  13.4 13.7 14.6  HCT  --   --  40.0 39.8 41.9  PLT  --   --  150 181 196  HEPARINUNFRC  --   < > 0.33 0.35 0.20*  CREATININE 1.10  --   --   --   --   TROPONINI 0.17*  --   --   --   --   < > = values in this interval not displayed.  Estimated Creatinine Clearance: 118.1 mL/min (by C-G formula based on Cr of 1.1).  Assessment: 50 y.o. male with DVT/PE for heparin  Goal of Therapy:  Heparin level 0.3-0.7 units/ml Monitor platelets by anticoagulation protocol: Yes    Plan:  Heparin 3000 units IV bolus, then increase heparin 2300 units/hr Check heparin level in 6 hours.   Geannie RisenGreg Tamiah Dysart, PharmD, BCPS    11/09/2015, 4:48 AM

## 2015-11-09 NOTE — Progress Notes (Addendum)
ANTICOAGULATION CONSULT NOTE - Follow Up Consult  Pharmacy Consult for Heparin>>Lovenox Indication: DVT/PE  No Known Allergies  Patient Measurements: Height: 6\' 4"  (193 cm) Weight: 279 lb 11.2 oz (126.871 kg) IBW/kg (Calculated) : 86.8 Heparin Dosing Weight:  112 kg  Vital Signs: Temp: 98.2 F (36.8 C) (01/08 0427) Temp Source: Oral (01/08 0427) BP: 115/77 mmHg (01/08 0427) Pulse Rate: 73 (01/08 0427)  Labs:  Recent Labs  11/07/15 0412 11/08/15 0240 11/09/15 0305 11/09/15 1133  HGB 13.4 13.7 14.6  --   HCT 40.0 39.8 41.9  --   PLT 150 181 196  --   HEPARINUNFRC 0.33 0.35 0.20* 0.86*    Estimated Creatinine Clearance: 118.1 mL/min (by C-G formula based on Cr of 1.1).  Assessment:  Anticoagulation: Heparin for new PE/DVT, Plan to start dabigatran for long-term ($10 co pay).  AM Level 0.2>>0.86. Infusing OK. CBC stable. No hemoptysis noted. Spoke with Dr. Sharon SellerMcClung who was ok with changing to Lovenox  Goal of Therapy:  Heparin level 0.3-0.7 units/ml Monitor platelets by anticoagulation protocol: Yes   Plan:  - d/c IV heparin - CBC q 72h - Lovenox 120mg  SQ BID - F/U teaching when patient starts Pradaxa 1/9   Katrin Grabel S. Merilynn Finlandobertson, PharmD, BCPS Clinical Staff Pharmacist Pager [phone]  Misty Stanleyobertson, Necie Wilcoxson Stillinger 11/09/2015,12:41 PM

## 2015-11-09 NOTE — Progress Notes (Addendum)
11/09/2015 10;03: Received CM consult for Benefit Check on Anticoagulant Medication for this 50 y.o. M who was admitted 11/05/2015 with syncopal episode and Intermittent SOB found to have PE and LLE DVT. Has been treated with Heparin which will be transitioned to po anticoagulant PRADAXA after 1500 11/10/2015.  Anticipate discharge home 11/10/2015. Pt is eligible for $10.00 Co-Pay card and will initiate benefit check, results  to be given to Donn Pier[facility]Kristi Webster [facility]  on Monday 11/10/2015. Explained this to patient and spouse at length and encouraged questions. Both understand importance of medication compliance.  Yvone NeuGenese M Ellyse Rotolo, RNCM [phone]

## 2015-11-09 NOTE — Progress Notes (Signed)
Pt. Walked 1200 ft  on room air 02 sat stayed above 95%. Pt. tolerated well

## 2015-11-09 NOTE — Progress Notes (Signed)
[place] TEAM 1 - Stepdown/ICU TEAM PROGRESS NOTE  Randy Hutchinson ZOX:[id number] DOB: [birth year] DOA: 11/05/2015 PCP: No primary care provider on file.  Admit HPI / Brief Narrative: 50 yo M Hx HTN who presented after a syncopal episode with c/o intermittent shortness of breath since the end of November associated with left lower leg and ankle cramping significantly worse with ambulation. Patient had a single episode of hemoptysis. The morning of his admission he had to lay down on the bathroom floor as he was feeling completely confused and then passed out.   HPI/Subjective: The pt is doing well.  He has no new complaints this morning, and feels somewhat less sob.  He has noted a modest amount of swelling in the L leg, but it is not particularly bothersome.  He is also noting a dry hacking cough.  No cp, n/v, abdom pain, or hemoptysis.    Assessment/Plan:  Pulmonary Embolism / Left Lower Extremity DVT -DVT confirmed in L popliteal and posterior tibial veins - this appears to been an unprovoked event with no significant contributing risk factors - patient will require hypercoagulable workup once he has completed an initial one year of treatment - after lengthy discussion he has chosen Pradaxa for his long-term treatment assuming it is affordable with his insurance coverage - awaiting info from CM on preauth and copay - will require 5 days of heparin tx prior to starting pradaxa (appears heparin was started here on 1/4 at 18:45, but pt/wife remember it being started around 2PM at [facility]) - ambulate today and attempt to d/c O2 support - plan for d/c home tomorrow afternoon if able to get pradaxa   Right heart strain / Diastolic Dysfunction -Mild to moderate - clinically stable at this time   Syncope -secondary to PE - no recurrence   Hypertension -Currently controlled  Obesity - Body mass index is 34.06 kg/(m^2).  Code Status: FULL Family Communication: lengthy discussion w/ pt and  wife again today  Disposition Plan: ambulate - wean O2 as able - probable home tomorrow on pradaxa after 5 days of heparin gtt completed (around 2-3PM)  Consultants: none  Procedures: 1/5 TTE - mild LVH - EF 55-60% - no WMA - grade 1 DD - RV systolic fxn moderately reduced  1/5 bilateral lower extremity Doppler subacute DVT noted in the left popliteal and posterior tibial veins  Antibiotics: none  DVT prophylaxis: IV heparin  Objective: Blood pressure 115/77, pulse 73, temperature 98.2 F (36.8 C), temperature source Oral, resp. rate 18, height 6\' 4"  (1.93 m), weight 126.871 kg (279 lb 11.2 oz), SpO2 96 %.  Intake/Output Summary (Last 24 hours) at 11/09/15 0903 Last data filed at 11/09/15 0819  Gross per 24 hour  Intake 747.17 ml  Output   1025 ml  Net -277.83 ml   Exam: General: No acute respiratory distress at rest  Lungs: Clear to auscultation bilaterally- no wheeze  Cardiovascular: Regular rate and rhythm without murmur  Abdomen: Nontender, nondistended, soft, bowel sounds positive, no rebound, no ascites, no appreciable mass Extremities: No significant cyanosis, or clubbing; trace edema L LE   Data Reviewed:  Basic Metabolic Panel:  Recent Labs Lab 11/05/15 2012 11/06/15 0810  NA 137 136  K 4.4 4.0  CL 105 105  CO2 23 25  GLUCOSE 127* 103*  BUN 17 16  CREATININE 1.20 1.10  CALCIUM 8.4* 8.2*  MG 2.1  --     CBC:  Recent Labs Lab 11/05/15 2012 11/06/15 0536  11/07/15 0412 11/08/15 0240 11/09/15 0305  WBC 9.3 9.9 9.7 7.8 9.2  NEUTROABS 5.5  --   --   --   --   HGB 15.2 14.2 13.4 13.7 14.6  HCT 43.9 40.5 40.0 39.8 41.9  MCV 89.4 90.8 90.5 89.0 89.7  PLT 142* 128* 150 181 196    Liver Function Tests:  Recent Labs Lab 11/05/15 2012 11/06/15 0810  AST 38 30  ALT 58 50  ALKPHOS 75 66  BILITOT 1.1 1.2  PROT 6.3* 5.6*  ALBUMIN 3.5 2.9*   Cardiac Enzymes:  Recent Labs Lab 11/05/15 2012 11/06/15 0050 11/06/15 0810  TROPONINI 0.39*  0.33* 0.17*    CBG:  Recent Labs Lab 11/06/15 0508 11/07/15 0808 11/08/15 0834  GLUCAP 91 86 92    Recent Results (from the past 240 hour(s))  MRSA PCR Screening     Status: None   Collection Time: 11/05/15  4:50 PM  Result Value Ref Range Status   MRSA by PCR NEGATIVE NEGATIVE Final    Comment:        The GeneXpert MRSA Assay (FDA approved for NASAL specimens only), is one component of a comprehensive MRSA colonization surveillance program. It is not intended to diagnose MRSA infection nor to guide or monitor treatment for MRSA infections.      Studies:   Recent x-ray studies have been reviewed in detail by the Attending Physician  Scheduled Meds:  Scheduled Meds: . antiseptic oral rinse  7 mL Mouth Rinse BID  . feeding supplement (ENSURE ENLIVE)  237 mL Oral BID BM    Time spent on care of this patient: 35 mins   Khayla Koppenhaver T , MD   [facility] Office  [phone] Pager - Text Page per Loretha StaplerAmion as per below:  On-Call/Text Page:      [email or web address]      password [email or web address]  If 7PM-7AM, please contact night-coverage [email or web address] Password [email or web address] 11/09/2015, 9:03 AM   LOS: 4 days

## 2015-11-10 MED ORDER — DABIGATRAN ETEXILATE MESYLATE 150 MG PO CAPS
150.0000 mg | ORAL_CAPSULE | Freq: Two times a day (BID) | ORAL | Status: DC
  Filled 2015-11-10: qty 1

## 2015-11-10 MED ORDER — DABIGATRAN ETEXILATE MESYLATE 150 MG PO CAPS: 150.0000 mg | ORAL_CAPSULE | Freq: Two times a day (BID) | ORAL | Status: AC

## 2015-11-10 MED ORDER — TRAMADOL HCL 50 MG PO TABS: 50.0000 mg | ORAL_TABLET | Freq: Four times a day (QID) | ORAL | Status: DC | PRN

## 2015-11-10 NOTE — Progress Notes (Addendum)
Insurance benefit coverage check completed for Pradaxa-  Pradaxa 150mg  BID: co-pay estimated through [facility], [place]: ** Auth required ([email or web address])- [phone], once approved $50 for 30 day retail -- pt has been given $10 copay card to use also.

## 2015-11-10 NOTE — Care Management Note (Signed)
Case Management Note Previous CM note initiated by [facility]ri[facility] Mayo RN, CM  Patient Details  Name: Randy Hutchinson MRN: [id number] Date of Birth: June 08, 1966  Subjective/Objective:    Pt has no PCP, has been going to urgent care, now wants to establish with physician in [place] or [place].  Pt is listed as self-pay but has insurance.  Wife will take insurance card to admitting to get insurance into system.  Provided number for [facility], wife will call to obtain names of physicians accepting new pts and will call one for appointment.                            Action/Plan: PTA pt lived at home with wife- plan to return home  Expected Discharge Date:  11/10/15               Expected Discharge Plan:  Home/Self Care  In-House Referral:     Discharge planning Services  CM Consult, Medication Assistance (Pradaxa Co-Pay)  Post Acute Care Choice:  NA Choice offered to:  NA  DME Arranged:    DME Agency:  NA  HH Arranged:  NA HH Agency:  NA  Status of Service:  Completed, signed off  Medicare Important Message Given:    Date Medicare IM Given:    Medicare IM give by:    Date Additional Medicare IM Given:    Additional Medicare Important Message give by:     If discussed at Long Length of Stay Meetings, dates discussed:    Discharge Disposition: Home/self care   Additional Comments:  11/10/15- Donn PieriniKristi Cyani Kallstrom RN, BSN- f/u done for Pradaxa-insurance check completed- Insurance benefit coverage check completed for Pradaxa- Pradaxa 150mg  BID: co-pay estimated through [facility] [place]: ** Auth required ([email or web address])- [phone], once approved $50 for 30 day retail -- pt has been given $10 copay card to use also. - spoke with pt and wife at bedside confirmed that they have copay card- and benefit coverage shared- MD aware of pre-auth and will take care of auth. Per pt's wife they have called [facility] # and have list of possible PCPs- will f/u and make appointment this week  for pt to establish with one. [facility] [place]- they do have drug in stock to fill prescription- pt aware - no further cm needs noted.    Darrold SpanWebster, Alois Mincer Hall, RN 11/10/2015, 2:15 PM

## 2015-11-10 NOTE — Progress Notes (Signed)
ANTICOAGULATION CONSULT NOTE - Follow Up Consult  Pharmacy Consult for pradaxa Indication: DVT/PE  No Known Allergies  Patient Measurements: Height: 6\' 4"  (193 cm) Weight: 281 lb 12 oz (127.8 kg) IBW/kg (Calculated) : 86.8 Heparin Dosing Weight:  112 kg  Vital Signs: Temp: 98.2 F (36.8 C) (01/09 0527) Temp Source: Oral (01/09 0527) BP: 109/79 mmHg (01/09 0527) Pulse Rate: 96 (01/09 0527)  Labs:  Recent Labs  11/08/15 0240 11/09/15 0305 11/09/15 1133  HGB 13.7 14.6  --   HCT 39.8 41.9  --   PLT 181 196  --   HEPARINUNFRC 0.35 0.20* 0.86*    Estimated Creatinine Clearance: 118.6 mL/min (by C-G formula based on Cr of 1.1).  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 Last dose of lovenox was 120mg  at about midnight -Hg= 14.6, plt= 196 -SCr  > 100  Goal of Therapy:  Monitor platelets by anticoagulation protocol: Yes   Plan:  -Pradaxa 150mg  po bid (first dose at 10 pm today) -will follow patient progress  Harland Germanndrew Sherrick Araki, Pharm D 11/10/2015 2:00 PM

## 2015-11-10 NOTE — Discharge Instructions (Signed)
Pulmonary Embolism °A pulmonary embolism (PE) is a sudden blockage or decrease of blood flow in one lung or both lungs. Most blockages come from a blood clot that travels from the legs or the pelvis to the lungs. PE is a dangerous and potentially life-threatening condition if it is not treated right away. °CAUSES °A pulmonary embolism occurs most commonly when a blood clot travels from one of your veins to your lungs. Rarely, PE is caused by air, fat, amniotic fluid, or part of a tumor traveling through your veins to your lungs. °RISK FACTORS °A PE is more likely to develop in: °· People who smoke. °· People who are older, especially over 60 years of age. °· People who are overweight (obese). °· People who sit or lie still for a long time, such as during long-distance travel (over 4 hours), bed rest, hospitalization, or during recovery from certain medical conditions like a stroke. °· People who do not engage in much physical activity (sedentary lifestyle). °· People who have chronic breathing disorders. °· People who have a personal or family history of blood clots or blood clotting disease. °· People who have peripheral vascular disease (PVD), diabetes, or some types of cancer. °· People who have heart disease, especially if the person had a recent heart attack or has congestive heart failure. °· People who have neurological diseases that affect the legs (leg paresis). °· People who have had a traumatic injury, such as breaking a hip or leg. °· People who have recently had major or lengthy surgery, especially on the hip, knee, or abdomen. °· People who have had a central line placed inside a large vein. °· People who take medicines that contain the hormone estrogen. These include birth control pills and hormone replacement therapy. °· Pregnancy or during childbirth or the postpartum period. °SIGNS AND SYMPTOMS  °The symptoms of a PE usually start suddenly and include: °· Shortness of breath while active or at  rest. °· Coughing or coughing up blood or blood-tinged mucus. °· Chest pain that is often worse with deep breaths. °· Rapid or irregular heartbeat. °· Feeling light-headed or dizzy. °· Fainting. °· Feeling anxious. °· Sweating. °There may also be pain and swelling in a leg if that is where the blood clot started. °These symptoms may represent a serious problem that is an emergency. Do not wait to see if the symptoms will go away. Get medical help right away. Call your local emergency services (911 in the [place] Do not drive yourself to the hospital. °DIAGNOSIS °Your health care provider will take a medical history and perform a physical exam. You may also have other tests, including: °· Blood tests to assess the clotting properties of your blood, assess oxygen levels in your blood, and find blood clots. °· Imaging tests, such as CT, ultrasound, MRI, X-ray, and other tests to see if you have clots anywhere in your body. °· An electrocardiogram (ECG) to look for heart strain from blood clots in the lungs. °TREATMENT °The main goals of PE treatment are: °· To stop a blood clot from growing larger. °· To stop new blood clots from forming. °The type of treatment that you receive depends on many factors, such as the cause of your PE, your risk for bleeding or developing more clots, and other medical conditions that you have. Sometimes, a combination of treatments is necessary. °This condition may be treated with: °· Medicines, including newer oral blood thinners (anticoagulants), warfarin, low molecular weight heparins, thrombolytics, or heparins. °· Wearing compression stockings or using different types   of devices. °· Surgery (rare) to remove the blood clot or to place a filter in your abdomen to stop the blood clot from traveling to your lungs. °Treatments for a PE are often divided into immediate treatment, long-term treatment (up to 3 months after PE), and extended treatment (more than 3 months after PE). Your  treatment may continue for several months. This is called maintenance therapy, and it is used to prevent the forming of new blood clots. You can work with your health care provider to choose the treatment program that is best for you. °What are anticoagulants?  °Anticoagulants are medicines that treat PEs. They can stop current blood clots from growing and stop new clots from forming. They cannot dissolve existing clots. Your body dissolves clots by itself over time. Anticoagulants are given by mouth, by injection, or through an IV tube. °What are thrombolytics?  °Thrombolytics are clot-dissolving medicines that are used to dissolve a PE. They carry a high risk of bleeding, so they tend to be used only in severe cases or if you have very low blood pressure. °HOME CARE INSTRUCTIONS °If you are taking a newer oral anticoagulant:  °· Take the medicine every single day at the same time each day. °· Understand what foods and drugs interact with this medicine. °· Understand that there are no regular blood tests required when using this medicine. °· Understand the side effects of this medicine, including excessive bruising or bleeding. Ask your health care provider or pharmacist about other possible side effects. °If you are taking warfarin: °· Understand how to take warfarin and know which foods can affect how warfarin works in your body. °· Understand that it is dangerous to take too much or too little warfarin. Too much warfarin increases the risk of bleeding. Too little warfarin continues to allow the risk for blood clots. °· Follow your PT and INR blood testing schedule. The PT and INR results allow your health care provider to adjust your dose of warfarin. It is very important that you have your PT and INR tested as often as told by your health care provider. °· Avoid major changes in your diet, or tell your health care provider before you change your diet. Arrange a visit with a registered dietitian to answer your  questions. Many foods, especially foods that are high in vitamin K, can interfere with warfarin and affect the PT and INR results. Eat a consistent amount of foods that are high in vitamin K, such as: °· Spinach, kale, broccoli, cabbage, collard greens, turnip greens, Brussels sprouts, peas, cauliflower, seaweed, and parsley. °· Beef liver and pork liver. °· Green tea. °· Soybean oil. °· Tell your health care provider about any and all medicines, vitamins, and supplements that you take, including aspirin and other over-the-counter anti-inflammatory medicines. Be especially cautious with aspirin and anti-inflammatory medicines. Do not take those before you ask your health care provider if it is safe to do so.  This is important because many medicines can interfere with warfarin and affect the PT and INR results. °· Do not start or stop taking any over-the-counter or prescription medicine unless your health care provider or pharmacist tells you to do so. °If you take warfarin, you will also need to do these things: °· Hold pressure over cuts for longer than usual. °· Tell your dentist and other health care providers that you are taking warfarin before you have any procedures in which bleeding may occur. °· Avoid alcohol or drink very small amounts. Tell your health care provider   if you change your alcohol intake. °· Do not use tobacco products, including cigarettes, chewing tobacco, and e-cigarettes. If you need help quitting, ask your health care provider. °· Avoid contact sports. °General Instructions °· Take over-the-counter and prescription medicines only as told by your health care provider. Anticoagulant medicines can have side effects, including easy bruising and difficulty stopping bleeding. If you are prescribed an anticoagulant, you will also need to do these things: °· Hold pressure over cuts for longer than usual. °· Tell your dentist and other health care providers that you are taking anticoagulants  before you have any procedures in which bleeding may occur. °· Avoid contact sports. °· Wear a medical alert bracelet or carry a medical alert card that says you have had a PE. °· Ask your health care provider how soon you can go back to your normal activities. Stay active to prevent new blood clots from forming. °· Make sure to exercise while traveling or when you have been sitting or standing for a long period of time. It is very important to exercise. Exercise your legs by walking or by tightening and relaxing your leg muscles often. Take frequent walks. °· Wear compression stockings as told by your health care provider to help prevent more blood clots from forming. °· Do not use tobacco products, including cigarettes, chewing tobacco, and e-cigarettes. If you need help quitting, ask your health care provider. °· Keep all follow-up appointments with your health care provider. This is important. °PREVENTION °Take these actions to decrease your risk of developing another PE: °· Exercise regularly. For at least 30 minutes every day, engage in: °¨ Activity that involves moving your arms and legs. °¨ Activity that encourages good blood flow through your body by increasing your heart rate. °· Exercise your arms and legs every hour during long-distance travel (over 4 hours). Drink plenty of water and avoid drinking alcohol while traveling. °· Avoid sitting or lying in bed for long periods of time without moving your legs. °· Maintain a weight that is appropriate for your height. Ask your health care provider what weight is healthy for you. °· If you are a woman who is over 35 years of age, avoid unnecessary use of medicines that contain estrogen. These include birth control pills. °· Do not smoke, especially if you take estrogen medicines.  If you need help quitting, ask your health care provider. °· If you are at very high risk for PE, wear compression stockings. °· If you recently had a PE, have regularly scheduled  ultrasound testing on your legs to check for new blood clots. °If you are hospitalized, prevention measures may include: °· Early walking after surgery, as soon as your health care provider says that it is safe. °· Receiving anticoagulants to prevent blood clots. If you cannot take anticoagulants, other options may be available, such as wearing compression stockings or using different types of devices. °SEEK IMMEDIATE MEDICAL CARE IF: °· You have new or increased pain, swelling, or redness in an arm or leg. °· You have numbness or tingling in an arm or leg. °· You have shortness of breath while active or at rest. °· You have chest pain. °· You have a rapid or irregular heartbeat. °· You feel light-headed or dizzy. °· You cough up blood. °· You notice blood in your vomit, bowel movement, or urine. °· You have a fever. °These symptoms may represent a serious problem that is an emergency. Do not wait to see if the symptoms will   go away. Get medical help right away. Call your local emergency services (911 in the [place] Do not drive yourself to the hospital. °  °This information is not intended to replace advice given to you by your health care provider. Make sure you discuss any questions you have with your health care provider. °  °Document Released: 10/15/2000 Document Revised: 07/09/2015 Document Reviewed: 02/12/2015 °[employer] Interactive Patient Education ©2016 [employer] °Deep Vein Thrombosis °A deep vein thrombosis (DVT) is a blood clot (thrombus) that usually occurs in a deep, larger vein of the lower leg or the pelvis, or in an upper extremity such as the arm. These are dangerous and can lead to serious and even life-threatening complications if the clot travels to the lungs. °A DVT can damage the valves in your leg veins so that instead of flowing upward, the blood pools in the lower leg. This is called post-thrombotic syndrome, and it can result in pain, swelling, discoloration, and sores on the  leg. °CAUSES °A DVT is caused by the formation of a blood clot in your leg, pelvis, or arm. Usually, several things contribute to the formation of blood clots. A clot may develop when: °· Your blood flow slows down. °· Your vein becomes damaged in some way. °· You have a condition that makes your blood clot more easily. °RISK FACTORS °A DVT is more likely to develop in: °· People who are older, especially over 60 years of age. °· People who are overweight (obese). °· People who sit or lie still for a long time, such as during long-distance travel (over 4 hours), bed rest, hospitalization, or during recovery from certain medical conditions like a stroke. °· People who do not engage in much physical activity (sedentary lifestyle). °· People who have chronic breathing disorders. °· People who have a personal or family history of blood clots or blood clotting disease. °· People who have peripheral vascular disease (PVD), diabetes, or some types of cancer. °· People who have heart disease, especially if the person had a recent heart attack or has congestive heart failure. °· People who have neurological diseases that affect the legs (leg paresis). °· People who have had a traumatic injury, such as breaking a hip or leg. °· People who have recently had major or lengthy surgery, especially on the hip, knee, or abdomen. °· People who have had a central line placed inside a large vein. °· People who take medicines that contain the hormone estrogen. These include birth control pills and hormone replacement therapy. °· Pregnancy or during childbirth or the postpartum period. °· Long plane flights (over 8 hours). °SIGNS AND SYMPTOMS °Symptoms of a DVT can include:  °· Swelling of your leg or arm, especially if one side is much worse. °· Warmth and redness of your leg or arm, especially if one side is much worse. °· Pain in your arm or leg. If the clot is in your leg, symptoms may be more noticeable or worse when you stand or  walk. °· A feeling of pins and needles, if the clot is in the arm. °The symptoms of a DVT that has traveled to the lungs (pulmonary embolism, PE) usually start suddenly and include: °· Shortness of breath while active or at rest. °· Coughing or coughing up blood or blood-tinged mucus. °· Chest pain that is often worse with deep breaths. °· Rapid or irregular heartbeat. °· Feeling light-headed or dizzy. °· Fainting. °· Feeling anxious. °· Sweating. °There may also be pain and   swelling in a leg if that is where the blood clot started. °These symptoms may represent a serious problem that is an emergency. Do not wait to see if the symptoms will go away. Get medical help right away. Call your local emergency services (911 in the [place] Do not drive yourself to the hospital. °DIAGNOSIS °Your health care provider will take a medical history and perform a physical exam. You may also have other tests, including: °· Blood tests to assess the clotting properties of your blood. °· Imaging tests, such as CT, ultrasound, MRI, X-ray, and other tests to see if you have clots anywhere in your body. °TREATMENT °After a DVT is identified, it can be treated. The type of treatment that you receive depends on many factors, such as the cause of your DVT, your risk for bleeding or developing more clots, and other medical conditions that you have. Sometimes, a combination of treatments is necessary. Treatment options may be combined and include: °· Monitoring the blood clot with ultrasound. °· Taking medicines by mouth, such as newer blood thinners (anticoagulants), thrombolytics, or warfarin. °· Taking anticoagulant medicine by injection or through an IV tube. °· Wearing compression stockings or using different types of devices. °· Surgery (rare) to remove the blood clot or to place a filter in your abdomen to stop the blood clot from traveling to your lungs. °Treatments for a DVT are often divided into immediate treatment and long-term  treatment (up to 3 months after DVT). You can work with your health care provider to choose the treatment program that is best for you. °HOME CARE INSTRUCTIONS °If you are taking a newer oral anticoagulant: °· Take the medicine every single day at the same time each day. °· Understand what foods and drugs interact with this medicine. °· Understand that there are no regular blood tests required when using this medicine. °· Understand the side effects of this medicine, including excessive bruising or bleeding. Ask your health care provider or pharmacist about other possible side effects. °If you are taking warfarin: °· Understand how to take warfarin and know which foods can affect how warfarin works in your body. °· Understand that it is dangerous to take too much or too little warfarin. Too much warfarin increases the risk of bleeding. Too little warfarin continues to allow the risk for blood clots. °· Follow your PT and INR blood testing schedule. The PT and INR results allow your health care provider to adjust your dose of warfarin. It is very important that you have your PT and INR tested as often as told by your health care provider. °· Avoid major changes in your diet, or tell your health care provider before you change your diet. Arrange a visit with a registered dietitian to answer your questions. Many foods, especially foods that are high in vitamin K, can interfere with warfarin and affect the PT and INR results. Eat a consistent amount of foods that are high in vitamin K, such as: °¨ Spinach, kale, broccoli, cabbage, collard greens, turnip greens, Brussels sprouts, peas, cauliflower, seaweed, and parsley. °¨ Beef liver and pork liver. °¨ Green tea. °¨ Soybean oil. °· Tell your health care provider about any and all medicines, vitamins, and supplements that you take, including aspirin and other over-the-counter anti-inflammatory medicines. Be especially cautious with aspirin and anti-inflammatory medicines.  Do not take those before you ask your health care provider if it is safe to do so. This is important because many medicines can interfere with warfarin   and affect the PT and INR results. °· Do not start or stop taking any over-the-counter or prescription medicine unless your health care provider or pharmacist tells you to do so. °If you take warfarin, you will also need to do these things: °· Hold pressure over cuts for longer than usual. °· Tell your dentist and other health care providers that you are taking warfarin before you have any procedures in which bleeding may occur. °· Avoid alcohol or drink very small amounts. Tell your health care provider if you change your alcohol intake. °· Do not use tobacco products, including cigarettes, chewing tobacco, and e-cigarettes. If you need help quitting, ask your health care provider. °· Avoid contact sports. °General Instructions °· Take over-the-counter and prescription medicines only as told by your health care provider. Anticoagulant medicines can have side effects, including easy bruising and difficulty stopping bleeding. If you are prescribed an anticoagulant, you will also need to do these things: °¨ Hold pressure over cuts for longer than usual. °¨ Tell your dentist and other health care providers that you are taking anticoagulants before you have any procedures in which bleeding may occur. °¨ Avoid contact sports. °· Wear a medical alert bracelet or carry a medical alert card that says you have had a PE. °· Ask your health care provider how soon you can go back to your normal activities. Stay active to prevent new blood clots from forming. °· Make sure to exercise while traveling or when you have been sitting or standing for a long period of time. It is very important to exercise. Exercise your legs by walking or by tightening and relaxing your leg muscles often. Take frequent walks. °· Wear compression stockings as told by your health care provider to help  prevent more blood clots from forming. °· Do not use tobacco products, including cigarettes, chewing tobacco, and e-cigarettes. If you need help quitting, ask your health care provider. °· Keep all follow-up appointments with your health care provider. This is important. °PREVENTION °Take these actions to decrease your risk of developing another DVT: °· Exercise regularly. For at least 30 minutes every day, engage in: °¨ Activity that involves moving your arms and legs. °¨ Activity that encourages good blood flow through your body by increasing your heart rate. °· Exercise your arms and legs every hour during long-distance travel (over 4 hours). Drink plenty of water and avoid drinking alcohol while traveling. °· Avoid sitting or lying in bed for long periods of time without moving your legs. °· Maintain a weight that is appropriate for your height. Ask your health care provider what weight is healthy for you. °· If you are a woman who is over 35 years of age, avoid unnecessary use of medicines that contain estrogen. These include birth control pills. °· Do not smoke, especially if you take estrogen medicines. If you need help quitting, ask your health care provider. °If you are hospitalized, prevention measures may include: °· Early walking after surgery, as soon as your health care provider says that it is safe. °· Receiving anticoagulants to prevent blood clots. If you cannot take anticoagulants, other options may be available, such as wearing compression stockings or using different types of devices. °SEEK IMMEDIATE MEDICAL CARE IF: °· You have new or increased pain, swelling, or redness in an arm or leg. °· You have numbness or tingling in an arm or leg. °· You have shortness of breath while active or at rest. °· You have chest pain. °· You   have a rapid or irregular heartbeat. °· You feel light-headed or dizzy. °· You cough up blood. °· You notice blood in your vomit, bowel movement, or urine. °These symptoms  may represent a serious problem that is an emergency. Do not wait to see if the symptoms will go away. Get medical help right away. Call your local emergency services (911 in the [place] Do not drive yourself to the hospital. °  °This information is not intended to replace advice given to you by your health care provider. Make sure you discuss any questions you have with your health care provider. °  °Document Released: 10/18/2005 Document Revised: 07/09/2015 Document Reviewed: 02/12/2015 °[employer] Interactive Patient Education ©2016 [employer] ° °

## 2015-11-10 NOTE — Discharge Summary (Signed)
DISCHARGE SUMMARY  Randy Hutchinson  MR#: [id number]  DOB:[birth year]  Date of Admission: 11/05/2015 Date of Discharge: 11/10/2015  Attending Physician:Cheryle Dark T  Patient's PCP:No primary care provider on file.  Consults:  none  Disposition: D/C home   Follow-up Appts:     Follow-up Information    Follow up with Your Primary Care Provider. Schedule an appointment as soon as possible for a visit in 4 days.     Tests Needing Follow-up: -CBC check is suggested in 4-5 days on anticoag -pt should be evaluated for fitness to return to work in 4-5 days  -anticoag holiday will be needed after 1 year of anticoag is completed, after which a full hypercoag w/u should be completed, w/ the pt returning to anticoag until the results are available to allow a decision on stopping anticoag after 1 year v/s lifelong anticoag   Discharge Diagnoses: Pulmonary Embolism / Left Lower Extremity DVT Right heart strain / Diastolic Dysfunction Syncope Hypertension Obesity - Body mass index is 34.06 kg/(m^2)  Initial presentation: 50 yo M Hx HTN who presented after a syncopal episode with c/o intermittent shortness of breath since the end of November associated with left lower leg and ankle cramping significantly worse with ambulation. Patient had a single episode of hemoptysis. The morning of his admission he had to lay down on the bathroom floor as he was feeling completely confused and then passed out.   Hospital Course:  Pulmonary Embolism / Left Lower Extremity DVT -DVT confirmed in L popliteal and posterior tibial veins - this appears to have been an unprovoked event with no significant contributing risk factors - patient will require hypercoagulable workup once he has completed an initial one year of treatment - after lengthy discussion he has chosen Pradaxa for his long-term treatment - CM has confirmed his insurance copay will be $50, and I have filled out the necessary preauth form -  completed 5 days of heparin/lovenox tx prior to starting pradaxa (heparin was started around 2PM at [facility]) - O2 weaned to RA prior to d/c and tolerated well    Right heart strain / Diastolic Dysfunction -Mild to moderate - clinically stable at time of d/c w/o evidence of volume overload   Syncope -secondary to PE - no recurrence   Hypertension -Currently controlled  Obesity - Body mass index is 34.06 kg/(m^2).    Medication List    STOP taking these medications        PROAIR RESPICLICK 108 (90 Base) MCG/ACT Aepb  Generic drug:  Albuterol Sulfate      TAKE these medications        dabigatran 150 MG Caps capsule  Commonly known as:  PRADAXA  Take 1 capsule (150 mg total) by mouth 2 (two) times daily.     traMADol 50 MG tablet  Commonly known as:  ULTRAM  Take 1 tablet (50 mg total) by mouth every 6 (six) hours as needed for moderate pain.       Day of Discharge BP 109/79 mmHg  Pulse 96  Temp(Src) 98.2 F (36.8 C) (Oral)  Resp 16  Ht 6\' 4"  (1.93 m)  Wt 127.8 kg (281 lb 12 oz)  BMI 34.31 kg/m2  SpO2 96%  Physical Exam: General: No acute respiratory distress Lungs: Clear to auscultation bilaterally without wheezes or crackles Cardiovascular: Regular rate and rhythm without murmur gallop or rub normal S1 and S2 Abdomen: Nontender, nondistended, soft, bowel sounds positive, no rebound, no ascites, no appreciable mass Extremities: No  significant cyanosis, clubbing, or edema bilateral lower extremities  Basic Metabolic Panel:  Recent Labs Lab 11/05/15 2012 11/06/15 0810  NA 137 136  K 4.4 4.0  CL 105 105  CO2 23 25  GLUCOSE 127* 103*  BUN 17 16  CREATININE 1.20 1.10  CALCIUM 8.4* 8.2*  MG 2.1  --     Liver Function Tests:  Recent Labs Lab 11/05/15 2012 11/06/15 0810  AST 38 30  ALT 58 50  ALKPHOS 75 66  BILITOT 1.1 1.2  PROT 6.3* 5.6*  ALBUMIN 3.5 2.9*   CBC:  Recent Labs Lab 11/05/15 2012 11/06/15 0536 11/07/15 0412 11/08/15 0240  11/09/15 0305  WBC 9.3 9.9 9.7 7.8 9.2  NEUTROABS 5.5  --   --   --   --   HGB 15.2 14.2 13.4 13.7 14.6  HCT 43.9 40.5 40.0 39.8 41.9  MCV 89.4 90.8 90.5 89.0 89.7  PLT 142* 128* 150 181 196    Cardiac Enzymes:  Recent Labs Lab 11/05/15 2012 11/06/15 0050 11/06/15 0810  TROPONINI 0.39* 0.33* 0.17*    CBG:  Recent Labs Lab 11/06/15 0508 11/07/15 0808 11/08/15 0834  GLUCAP 91 86 92    Recent Results (from the past 240 hour(s))  MRSA PCR Screening     Status: None   Collection Time: 11/05/15  4:50 PM  Result Value Ref Range Status   MRSA by PCR NEGATIVE NEGATIVE Final    Comment:        The GeneXpert MRSA Assay (FDA approved for NASAL specimens only), is one component of a comprehensive MRSA colonization surveillance program. It is not intended to diagnose MRSA infection nor to guide or monitor treatment for MRSA infections.     Time spent in discharge (includes decision making & examination of pt): >30 minutes  11/10/2015, 1:57 PM   Lonia Blood, MD [facility] Office  [phone] Pager [phone]  On-Call/Text Page:      [email or web address]      password [facility]

## 2015-11-10 NOTE — Progress Notes (Signed)
Discussed with the patient and his wife and all questioned fully answered. He will call me if any problems arise. Discharge teaching included anticoagulation therapy, when to call the MD, and follow up appointments.   Leonidas Rombergaitlin S Bumbledare, RN

## 2015-11-27 ENCOUNTER — Emergency Department (HOSPITAL_COMMUNITY)
Admission: EM | Admit: 2015-11-27 | Discharge: 2015-11-27 | Disposition: A | Discharge status: Home / Self Care | Payer: PRIVATE HEALTH INSURANCE | Attending: Emergency Medicine | Admitting: Emergency Medicine

## 2015-11-27 ENCOUNTER — Emergency Department (HOSPITAL_COMMUNITY): Payer: PRIVATE HEALTH INSURANCE

## 2015-11-27 ENCOUNTER — Encounter (HOSPITAL_COMMUNITY): Payer: Self-pay | Admitting: Emergency Medicine

## 2015-11-27 DIAGNOSIS — R079 Chest pain, unspecified: Secondary | ICD-10-CM | POA: Diagnosis present

## 2015-11-27 DIAGNOSIS — Z86711 Personal history of pulmonary embolism: Secondary | ICD-10-CM | POA: Insufficient documentation

## 2015-11-27 DIAGNOSIS — R0981 Nasal congestion: Secondary | ICD-10-CM

## 2015-11-27 DIAGNOSIS — Z86718 Personal history of other venous thrombosis and embolism: Secondary | ICD-10-CM | POA: Diagnosis not present

## 2015-11-27 DIAGNOSIS — Z7902 Long term (current) use of antithrombotics/antiplatelets: Secondary | ICD-10-CM | POA: Insufficient documentation

## 2015-11-27 DIAGNOSIS — R0602 Shortness of breath: Secondary | ICD-10-CM | POA: Diagnosis not present

## 2015-11-27 DIAGNOSIS — R05 Cough: Secondary | ICD-10-CM | POA: Diagnosis not present

## 2015-11-27 HISTORY — DX: Other pulmonary embolism without acute cor pulmonale: I26.99

## 2015-11-27 HISTORY — DX: Acute embolism and thrombosis of unspecified deep veins of unspecified lower extremity: I82.409

## 2015-11-27 LAB — CBC
HCT: 44.3 % (ref 39.0–52.0)
Hemoglobin: 15.5 g/dL (ref 13.0–17.0)
MCH: 30.8 pg (ref 26.0–34.0)
MCHC: 35 g/dL (ref 30.0–36.0)
MCV: 87.9 fL (ref 78.0–100.0)
Platelets: 104 10*3/uL — ABNORMAL LOW (ref 150–400)
RBC: 5.04 MIL/uL (ref 4.22–5.81)
RDW: 12.3 % (ref 11.5–15.5)
WBC: 8.7 10*3/uL (ref 4.0–10.5)

## 2015-11-27 LAB — BASIC METABOLIC PANEL
ANION GAP: 9 (ref 5–15)
BUN: 15 mg/dL (ref 6–20)
CALCIUM: 8.9 mg/dL (ref 8.9–10.3)
CO2: 22 mmol/L (ref 22–32)
Chloride: 107 mmol/L (ref 101–111)
Creatinine, Ser: 1.02 mg/dL (ref 0.61–1.24)
GFR calc Af Amer: >60 mL/min (ref >60)
GLUCOSE: 105 mg/dL — AB (ref 65–99)
Potassium: 4.4 mmol/L (ref 3.5–5.1)
Sodium: 138 mmol/L (ref 135–145)

## 2015-11-27 LAB — I-STAT TROPONIN, ED: TROPONIN I, POC: 0 ng/mL (ref 0.00–0.08)

## 2015-11-27 MED ORDER — TRIAMCINOLONE ACETONIDE 55 MCG/ACT NA AERO: 2.0000 | INHALATION_SPRAY | Freq: Every day | NASAL | Status: AC

## 2015-11-27 MED ORDER — CALCIUM POLYCARBOPHIL 625 MG PO TABS: 625.0000 mg | ORAL_TABLET | Freq: Every day | ORAL | Status: AC

## 2015-11-27 MED ORDER — IOHEXOL 350 MG/ML SOLN
100.0000 mL | Freq: Once | INTRAVENOUS | Status: AC | PRN
  Administered 2015-11-27: 100 mL via INTRAVENOUS

## 2015-11-27 NOTE — ED Notes (Signed)
Pt from home for eval of sudden onset of sob and left sided cp this morning when he woke up, pt recently dx with bilateral PE and left DVT and on blood thinners. nad noted at this time however pt uncomfortable in triage.

## 2015-11-27 NOTE — ED Provider Notes (Signed)
Medical screening examination/treatment/procedure(s) were conducted as a shared visit with non-physician practitioner(s) and myself.  I personally evaluated the patient during the encounter.   EKG Interpretation   Date/Time:  Thursday November 27 2015 12:53:10 EST Ventricular Rate:  82 PR Interval:  140 QRS Duration: 86 QT Interval:  338 QTC Calculation: 394 R Axis:   105 Text Interpretation:  Sinus rhythm with sinus arrhythmia with occasional  Premature ventricular complexes Rightward axis Borderline ECG Confirmed by  Ranae Palms  MD, DAVID ([id number]) on 11/27/2015 2:51:16 PM     50 y.o. male presents with increased shortness of breath and left sided pain today. No acute distress on exam. H/o DVT/PE on pradaxa. CTA to r/o increased clot burden.   No increasing clot burden, having change in symptoms likely related to natural progression of illness on anticoagulation and migration of previous saddle embolus. There is no evidence that his current anticoagulation medication is not working appropriately. No significant vital sign abnormalities requiring admission and resolving right heart strain signs on CT repeat. Plan to follow up with PCP as needed and return precautions discussed for worsening or new concerning symptoms.   Also having some nasal congestion/cough and diarrhea since starting new meds. Start on fiber supplementation and nasal steroid. Plan to follow up with PCP as needed and return precautions discussed for worsening or new concerning symptoms.   See related encounter note   Lyndal Pulley, MD 11/28/15 0222

## 2015-11-27 NOTE — ED Provider Notes (Signed)
CSN: [id number]     Arrival date & time 11/27/15  1248 History   First MD Initiated Contact with Patient 11/27/15 1500     Chief Complaint  Patient presents with  . Chest Pain  . Shortness of Breath     (Consider location/radiation/quality/duration/timing/severity/associated sxs/prior Treatment) HPI   Randy Hutchinson is a 50 y.o. male, with a history of PE, DVT, and hypertension, presenting to the ED with shortness of breath and chest pain that came on suddenly this morning. Pt states, "It feels like I just can't catch my breath." Pt rates his chest pain at 3/10, describes it as a achy pressure, located on the left side of his chest, non-radiating. Pt states when he coughs "there's a taste of blood" in his mouth, but denies gross hemoptysis. Pt denies fever/chills, leg swelling or pain, recent trauma, N/V, or any other complaints. Pt states he was admitted to the hospital on 1/4 for PE and DVTs. Pt was placed on Pradaxa and pt states he has been compliant. Patient also states that his shortness of breath and chest pain from that PE had pretty much resolved, especially the exertional shortness of breath.    Past Medical History  Diagnosis Date  . Essential hypertension 11/05/2015  . Pulmonary emboli (HCC)   . DVT (deep venous thrombosis) [facility])    Past Surgical History  Procedure Laterality Date  . Back surgery      trauma sustained from fall   Family History  Problem Relation Age of Onset  . Cancer Father     lymphoma  . Heart attack Paternal Grandfather    Social History  Substance Use Topics  . Smoking status: Never Smoker   . Smokeless tobacco: None  . Alcohol Use: 0.0 oz/week    0 Standard drinks or equivalent per week    Review of Systems  Constitutional: Negative for fever, chills and diaphoresis.  Respiratory: Positive for cough and shortness of breath.   Cardiovascular: Positive for chest pain.  All other systems reviewed and are negative.     Allergies  Review  of patient's allergies indicates no known allergies.  Home Medications   Prior to Admission medications   Medication Sig Start Date End Date Taking? Authorizing Provider  dabigatran (PRADAXA) 150 MG CAPS capsule Take 1 capsule (150 mg total) by mouth 2 (two) times daily. 11/10/15  Yes Lonia Blood, MD  polycarbophil (FIBERCON) 625 MG tablet Take 1 tablet (625 mg total) by mouth daily. 11/27/15   Antonae Zbikowski C Wiktoria Hemrick, PA-C  triamcinolone (NASACORT ALLERGY 24HR) 55 MCG/ACT AERO nasal inhaler Place 2 sprays into the nose daily. 11/27/15   Sherah Lund C Evadene Wardrip, PA-C   BP 109/75 mmHg  Pulse 68  Temp(Src) 98 F (36.7 C) (Oral)  Resp 23  Ht  (1.93 m)  Wt 124.739 kg  BMI 33.49 kg/m2  SpO2 95% Physical Exam  Constitutional: He appears well-developed and well-nourished. No distress.  HENT:  Head: Normocephalic and atraumatic.  Eyes: Conjunctivae are normal. Pupils are equal, round, and reactive to light.  Cardiovascular: Normal rate, regular rhythm and normal heart sounds.   Pulmonary/Chest: Effort normal and breath sounds normal. Tachypnea noted. No respiratory distress.  Abdominal: Soft. Bowel sounds are normal.  Musculoskeletal: He exhibits no edema or tenderness.  No unilateral leg swelling, redness, or increased tenderness.  Neurological: He is alert.  Skin: Skin is warm and dry. He is not diaphoretic.  Nursing note and vitals reviewed.   ED Course  Procedures (  including critical care time) Labs Review Labs Reviewed  BASIC METABOLIC PANEL - Abnormal; Notable for the following:    Glucose, Bld 105 (*)    All other components within normal limits  CBC - Abnormal; Notable for the following:    Platelets 104 (*)    All other components within normal limits  I-STAT TROPOININ, ED    Imaging Review Dg Chest 2 View  11/27/2015  CLINICAL DATA:  Shortness of breath, chest pain EXAM: CHEST  2 VIEW COMPARISON:  11/06/2015 FINDINGS: Cardiomediastinal silhouette is stable. No acute infiltrate or  pleural effusion. No pulmonary edema. Stable mild basilar atelectasis or scarring. Bony thorax is unremarkable. IMPRESSION: No active cardiopulmonary disease. Stable mild basilar atelectasis or scarring. Electronically Signed   By: Natasha Mead M.D.   On: 11/27/2015 13:29   Ct Angio Chest Pe W/cm &/or Wo Cm  11/27/2015  CLINICAL DATA:  The patient presents with shortness of breath. Left-sided chest pain this morning. He had a saddle embolus 3 weeks ago and was discharged on November 10, 2015. The patient is on anticoagulants. EXAM: CT ANGIOGRAPHY CHEST WITH CONTRAST TECHNIQUE: Multidetector CT imaging of the chest was performed using the standard protocol during bolus administration of intravenous contrast. Multiplanar CT image reconstructions and MIPs were obtained to evaluate the vascular anatomy. CONTRAST:  OMNIPAQUE IOHEXOL 350 MG/ML SOLN COMPARISON:  November 05, 2015 FINDINGS: Again noted are bilateral pulmonary emboli. The saddle portion of the embolus seen previously is no longer present in this location. The embolus burden on the left has significantly improved although multiple small emboli remain in both the upper and lower lobes on the left. A significant embolus burden remains on the right with decreased opacification of more peripheral upper, middle, and lower lobe branches on this study compared to the previous. I suspect a portion of the previously-seen saddle embolus has migrated distally into the right pulmonary artery system. The left ventricular volume is improved in the interval consistent with decreased right ventricular strain. The thoracic aorta is poorly opacified but non aneurysmal. The heart is otherwise unchanged. There is a small right pleural effusion with no pericardial effusion or left effusion. No adenopathy. Central airways are normal. No pneumothorax. No pulmonary infarcts or suspicious infiltrates identified. No suspicious pulmonary nodules or masses. Evaluation of the upper  abdomen is limited but unremarkable. The visualized bones are normal as well. Review of the MIP images confirms the above findings. IMPRESSION: 1. The pulmonary embolus burden has decreased on the left and increased on the right. There is decreased opacification of more distal right pulmonary arteries today versus the previous study. I suspect the findings are due to distal migration of the previously seen saddle embolus preferentially to the right. Findings called to Dr. Clydene Pugh. Electronically Signed   By: Gerome Sam III M.D   On: 11/27/2015 16:56   I have personally reviewed and evaluated these images and lab results as part of my medical decision-making.   EKG Interpretation   Date/Time:  Thursday November 27 2015 12:53:10 EST Ventricular Rate:  82 PR Interval:  140 QRS Duration: 86 QT Interval:  338 QTC Calculation: 394 R Axis:   105 Text Interpretation:  Sinus rhythm with sinus arrhythmia with occasional  Premature ventricular complexes Rightward axis Borderline ECG Confirmed by  Ranae Palms  MD, DAVID ([id number]) on 11/27/2015 2:51:16 PM      MDM   Final diagnoses:  Shortness of breath  Chest pain, unspecified chest pain type  History of pulmonary  embolism  Nasal congestion    STRATTON VILLWOCK presents with shortness of breath and chest pain since this morning.  Findings and plan of care discussed with Lyndal Pulley, MD.  Patient is at moderate risk for PE with 6 points on Wells criteria, most notably for history of PE, likely diagnosis, and recent immobilization. Patient also has a drop in his SPO2 to 96% on room air. CTA of the chest is indicated for this patient. Patient has a HEART score of 2, indicating low risk. Resolution of right heart strain previously seen. PE resolved on the left and seems to have moved partially to the right. No change in clot burden. Pt vital signs are normal. SpO2 is expected for the stage of recovery the patient is in. Patient was able to be ambulated  with no difficulty. Patient appears stable for discharge this time, given patient's current presentation and vital signs. Patient was reassessed multiple times while here in the ED with no decline in status. The patient was given instructions for home care as well as return precautions. Patient voices understanding of these instructions, accepts the plan, and is comfortable with discharge.  Filed Vitals:   11/27/15 1531 11/27/15 1600 11/27/15 1611 11/27/15 1615  BP:  109/75    Pulse: 68 63 66 68  Temp:      TempSrc:      Resp: Height:      Weight:      SpO2: 95% 96% 95% 95%     Anselm Pancoast, PA-C 11/27/15 1743  Lyndal Pulley, MD 11/28/15 0222

## 2015-11-27 NOTE — ED Notes (Signed)
Patient refused discharge vitals.

## 2015-11-27 NOTE — Discharge Instructions (Signed)
You have been seen today for shortness of breath as well as nasal congestion. The CT showed improvement in your blood clot with a shift in the clot. No new blood clots were found. Follow up with PCP in the next week for a check up. Return to ED should symptoms worsen. Use of FiberCon for loose stools. Nasacort for nasal congestion.   Emergency Department Resource Guide 1) Find a Doctor and Pay Out of Pocket Although you won't have to find out who is covered by your insurance plan, it is a good idea to ask around and get recommendations. You will then need to call the office and see if the doctor you have chosen will accept you as a new patient and what types of options they offer for patients who are self-pay. Some doctors offer discounts or will set up payment plans for their patients who do not have insurance, but you will need to ask so you aren't surprised when you get to your appointment.  2) Contact Your Local Health Department Not all health departments have doctors that can see patients for sick visits, but many do, so it is worth a call to see if yours does. If you don't know where your local health department is, you can check in your phone book. The CDC also has a tool to help you locate your state's health department, and many state websites also have listings of all of their local health departments.  3) Find a Walk-in Clinic If your illness is not likely to be very severe or complicated, you may want to try a walk in clinic. These are popping up all over the country in pharmacies, drugstores, and shopping centers. They're usually staffed by nurse practitioners or physician assistants that have been trained to treat common illnesses and complaints. They're usually fairly quick and inexpensive. However, if you have serious medical issues or chronic medical problems, these are probably not your best option.  No Primary Care Doctor: - Call Health Connect at  [phone] - they can help you  locate a primary care doctor that  accepts your insurance, provides certain services, etc. - Physician Referral Service- [phone]  Chronic Pain Problems: Organization         Address  Phone   Notes  [facility]  [phone] Patients need to be referred by their primary care doctor.   Medication Assistance: Organization         Address  Phone   Notes  [facility] Medication [facility] [address] [place], [place] [address] [phone] --Must be a resident of [facility] -- Must have NO insurance coverage whatsoever (no Medicaid/ Medicare, etc.) -- The pt. MUST have a primary care doctor that directs their care regularly and follows them in the community   [facility]  [phone]   [employer]  [phone]    Agencies that provide inexpensive medical care: Organization         Address  Phone   Notes  [facility]  [phone]   [facility]    [phone]   [facility] [address] [place], [place] [address] [phone]   [facility] [address] [place], [place] [phone]   [facility]anned [facility]    [phone]   [facility]    [phone]   [facility]  [address] [place], [place] Phone:  [phone], Fax:  [phone] Hours of Operation:  9 am - 6 pm, M-F.  Also accepts Medicaid/Medicare and self-pay.  [facility] for [place], [address], [place] Phone: [phone], Fax: [phone]. Hours of Operation:  8:30 am - 5:30 pm, M-F.  Also accepts Medicaid and self-pay.  [facility] [place] [address] [place] Phone: [phone]   [place], [place], [place] [phone], Ext. [phone] Mondays & Thursdays: 7-9 AM.  First 15 patients are seen on a first come, first serve basis.    [place]  Providers:  Organization         Address  Phone   Notes  [facility] [address], [place] [phone] Also accepts self-pay patients.  [facility] [address], [place]  [phone]   [place], [place] [phone]   [facility] [address], [place] [phone]   Lucianne Lei [address], [place]   [phone] Only accepts [place] Access [place] patients after they have their name applied to their card.   Self-Pay (no insurance) in [facility]:  Organization         Address  Phone   Notes  Sickle Cell Patients, [facility] [place] [phone]   [facility] [place] [phone]   [facility] [place]  [place], [place], [place] [phone]   [facility]/Dr. Osei-Bonsu  [address] [place] or [address], [place] [phone] Phone number for both [place] and [place] locations is the same.  [facility] [address], [place] [phone]   [facility] [address] [place] or [address] [phone]   [facility] [address] [place] [phone], phone; [phone], fax Sees patients 1st and 3rd Saturday of every month.  Must not qualify for public or private insurance (i.e. Medicaid, Medicare, [place] Health Choice, Veterans' Benefits)  Household income should be no more than 200% of the poverty level The clinic cannot treat you if you are pregnant or think you are pregnant  Sexually transmitted diseases are not treated at the clinic.    Dental Care: Organization         Address  Phone  Notes  [facility] of [facility] [phone] Accepts children up to age 2 who are enrolled in [place] or [place]; pregnant women with a Medicaid card; and children who have applied for Medicaid or [place] Health Choice, but were declined, whose parents can pay a reduced fee at time of service.  [facility] Department of [facility]  [address], [place] [phone] Accepts children up to age 72 who are enrolled in [place] or [place]; pregnant women with a Medicaid card; and children who have applied for Medicaid or [place] Health Choice, but were declined, whose parents can pay a reduced fee at time of service.  [facility]  [place] [phone] Patients are seen by appointment only. Walk-ins  are not accepted. [place] will see patients 76 years of age and older. Monday - Tuesday (8am-5pm) Most Wednesdays (8:30-5pm) $30 per visit, cash only  [facility]  [address], [facility] [phone] Patients are seen by appointment only. Walk-ins are not accepted. [place] will see patients 32 years of age and older. One Wednesday Evening (Monthly: Volunteer Based).  $30 per visit, cash only  [place]  [phone] for adults; Children under age 64, call Graduate Pediatric Dentistry at [phone]. Children aged 51-14, please call [phone] to request a pediatric application.  Dental services are provided in all areas of dental care including fillings, crowns and bridges, complete and partial dentures, implants, gum treatment, root canals, and extractions. Preventive care is also provided. Treatment is provided to both adults and children. Patients are selected via a lottery and there is often a waiting list.   [facility] [address] [place]  [phone] [email or web address]   [facility]  [address] [place], [place] [phone], Ext. [phone] Second and Fourth Thursday of each month, opens at 6:30 AM; Clinic ends at 9 AM.  Patients are seen on a first-come first-served basis, and a limited number are seen during each clinic.   [facility]  [address] [place], [place] [phone]   Eligibility Requirements You must have lived in [place], [place], or [place] counties for at least the last three months.   You cannot be eligible for state or federal sponsored [employer], including [employer], [place], or [employer].   You generally cannot be eligible for healthcare insurance through your employer.    How to apply: Eligibility screenings are held every Tuesday and Wednesday afternoon from 1:00 pm until 4:00 pm. You do not need an appointment for the interview!  [facility] [address], [place], [place]   [place]  [place] Department  [place]  [phone]    Behavioral Health Resources in the Community: Intensive Outpatient Programs Organization         Address  Phone  Notes  [place]. [address], [place], [place] [phone]   [facility] Outpatient [address], [place], [place]   [facility] [address], [place], [place]   [place] [address]  [place], [place] or [phone]   Substance Abuse Resources Organization         Address  Phone  Notes  [facility]  [phone]   [place]  [phone]   [facility] [place]   Chinita Pester  [phone]   Residential & Outpatient Substance Abuse Program  [phone]   Psychological Services Organization         Address  Phone  Notes  [facility] [place]  [place]  [phone]   [place] [address], [place] [phone] or [phone]    Mobile Crisis Teams Organization         Address  Phone  Notes  [facility], Mobile Crisis Care Unit  [phone]   [facility]  [address] [place], [place]   [facility] [address] [place] [phone]  Self-Help/Support Groups °Organization         Address  Phone             Notes  °[facility] [place] - variety of support groups  336- [phone] Call for more information  °Narcotics Anonymous (NA), [facility] [address], °[place]  2 meetings at this location  ° °Residential Treatment Programs °Organization         Address  Phone  Notes  °[facility] [address],    °[place]  [phone]   °[facility] ° [address], [place], [place] [phone]   °[facility] [address], [place] [phone] Admissions: 8am-3pm M-F  °[facility] [address]    °[place], [place] [phone]   °[facility] [address], [place], [place] [phone]   °[facility] [address]  °[place], [place] [phone]   °[facility] - Intensive Outpatient [address], [place], [place] [phone]   °[facility] [address]  °[place], [place] 1-877-615-2722 or [phone]   °[facility] ([facility]) [address] [place], [place] [phone] Accepts Medicaid  °[facility] [address]  °[place] 1-800-659-3381 Substance Abuse/Addiction Treatment  ° °[place] Behavioral Health Resources °Organization         Address  Phone  Notes  °[facility]  (888) [phone]   °Julie Brannon, PhD [address] [place], [place]   (336) [phone] or (336) 951-0000    °[facility]   [address] °[place], [place] ([phone]) [phone]   °[facility] [address], [place], [place] (336) [phone] Insurance/Medicaid/sponsorship through [facility]  °[facility] [address]                                    [place], [place] [phone]) [phone] Therapy/tele-psych/case  °[facility] [address]  ° [place], [place] ([phone]) [phone]    °Dr. Arfeen  ([phone]) [phone]   °[facility] [place]  [employer] [place] [facility] 1) [address], [place] °2) [address], [place] °3)  [address], [place] ([phone]) [phone] °(336) [phone] ° °(336) [phone]   °[place] Child Abuse Hotline (336) 342-1394 or (336) 342-3537 (After Hours)    ° ° ° °

## 2017-01-10 DIAGNOSIS — I82432 Acute embolism and thrombosis of left popliteal vein: Secondary | ICD-10-CM | POA: Diagnosis not present

## 2017-01-10 DIAGNOSIS — Z86711 Personal history of pulmonary embolism: Secondary | ICD-10-CM | POA: Diagnosis not present

## 2017-01-24 DIAGNOSIS — Z86711 Personal history of pulmonary embolism: Secondary | ICD-10-CM | POA: Diagnosis not present

## 2017-01-24 DIAGNOSIS — Z86718 Personal history of other venous thrombosis and embolism: Secondary | ICD-10-CM | POA: Diagnosis not present

## 2017-01-24 DIAGNOSIS — Z7901 Long term (current) use of anticoagulants: Secondary | ICD-10-CM | POA: Diagnosis not present

## 2017-10-15 IMAGING — CR DG CHEST 2V
2 series · 2 of 2 positions shown · non-contrast
Comparison: 11/06/2015

CLINICAL DATA: Shortness of breath, chest pain

EXAM:
CHEST  2 VIEW

[chest pa]
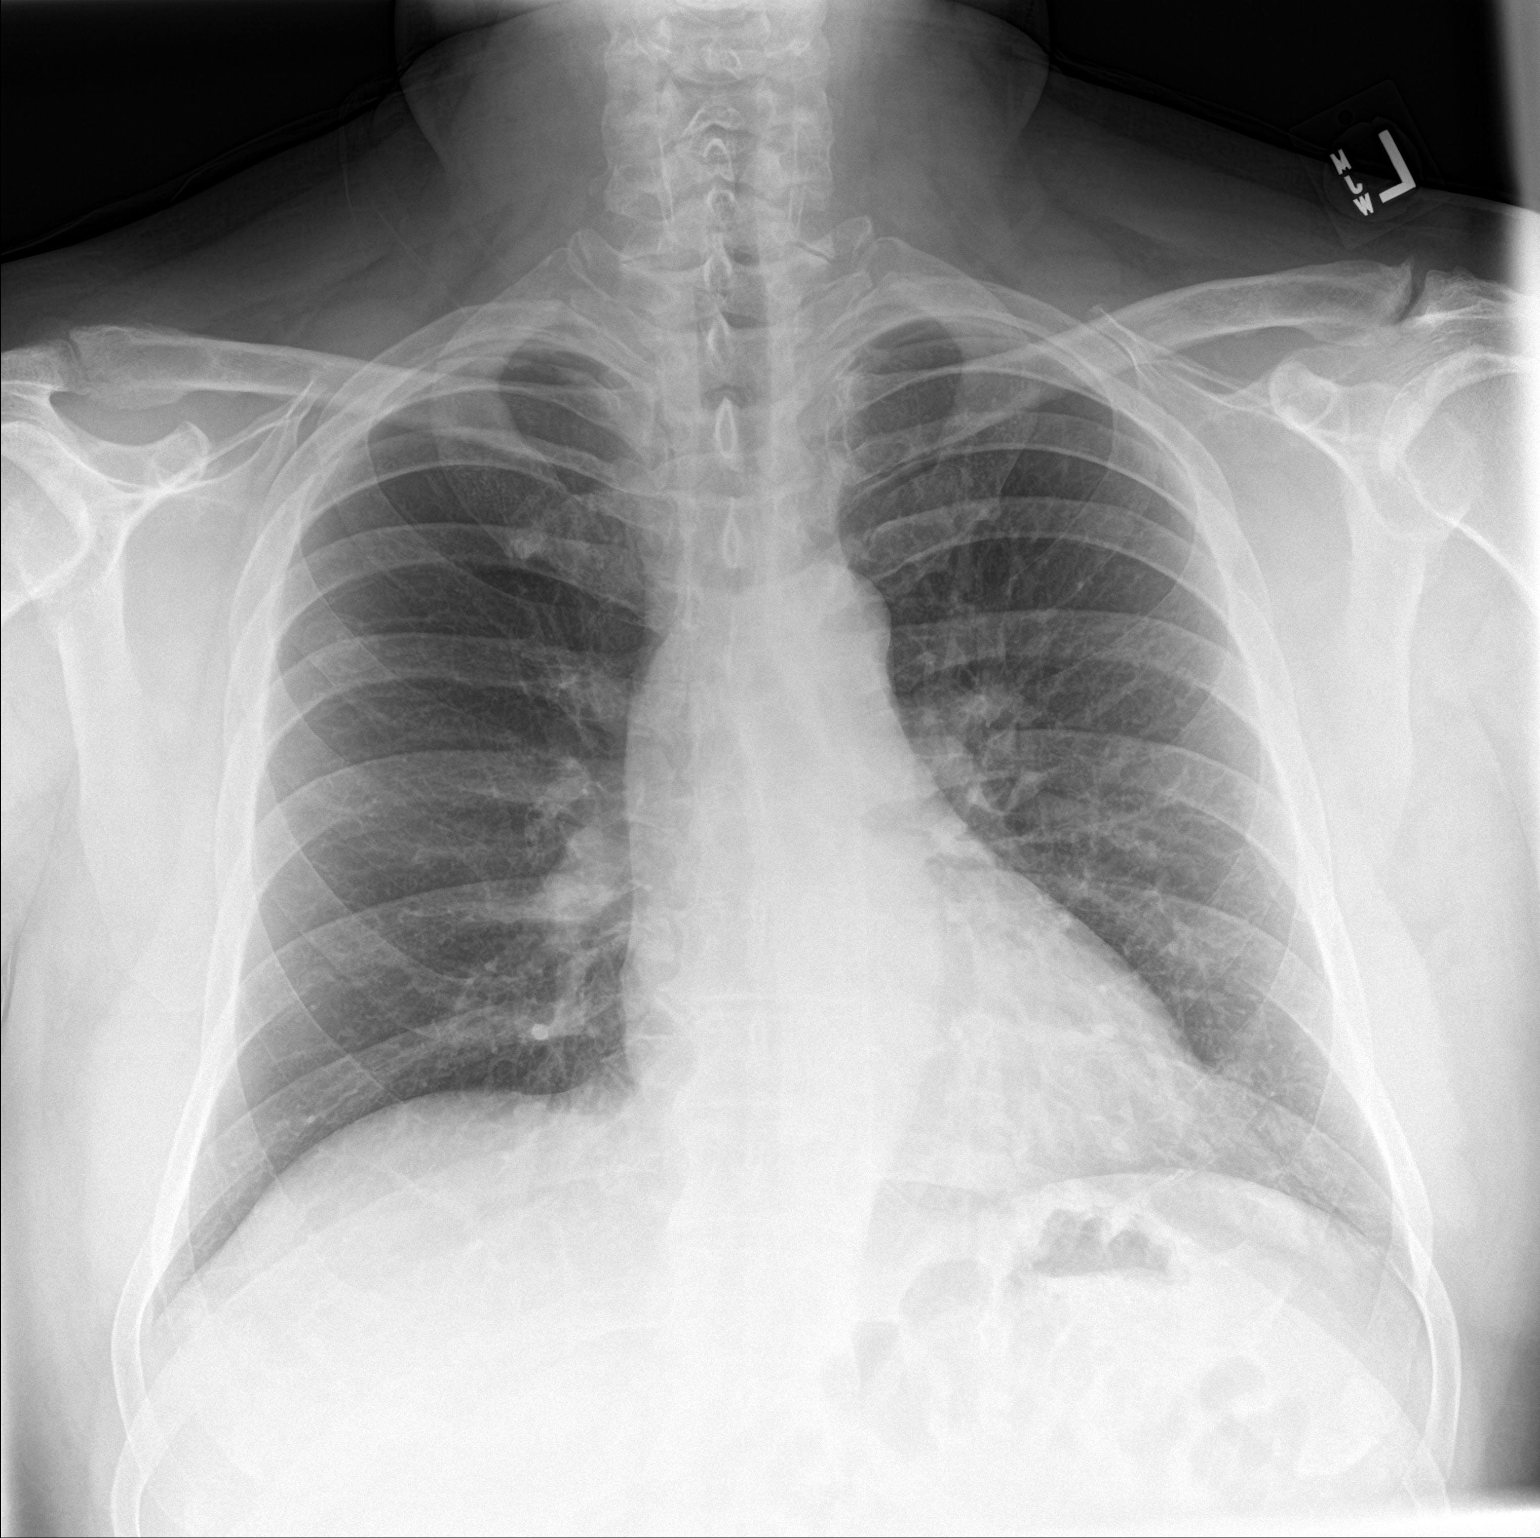

[chest lat]
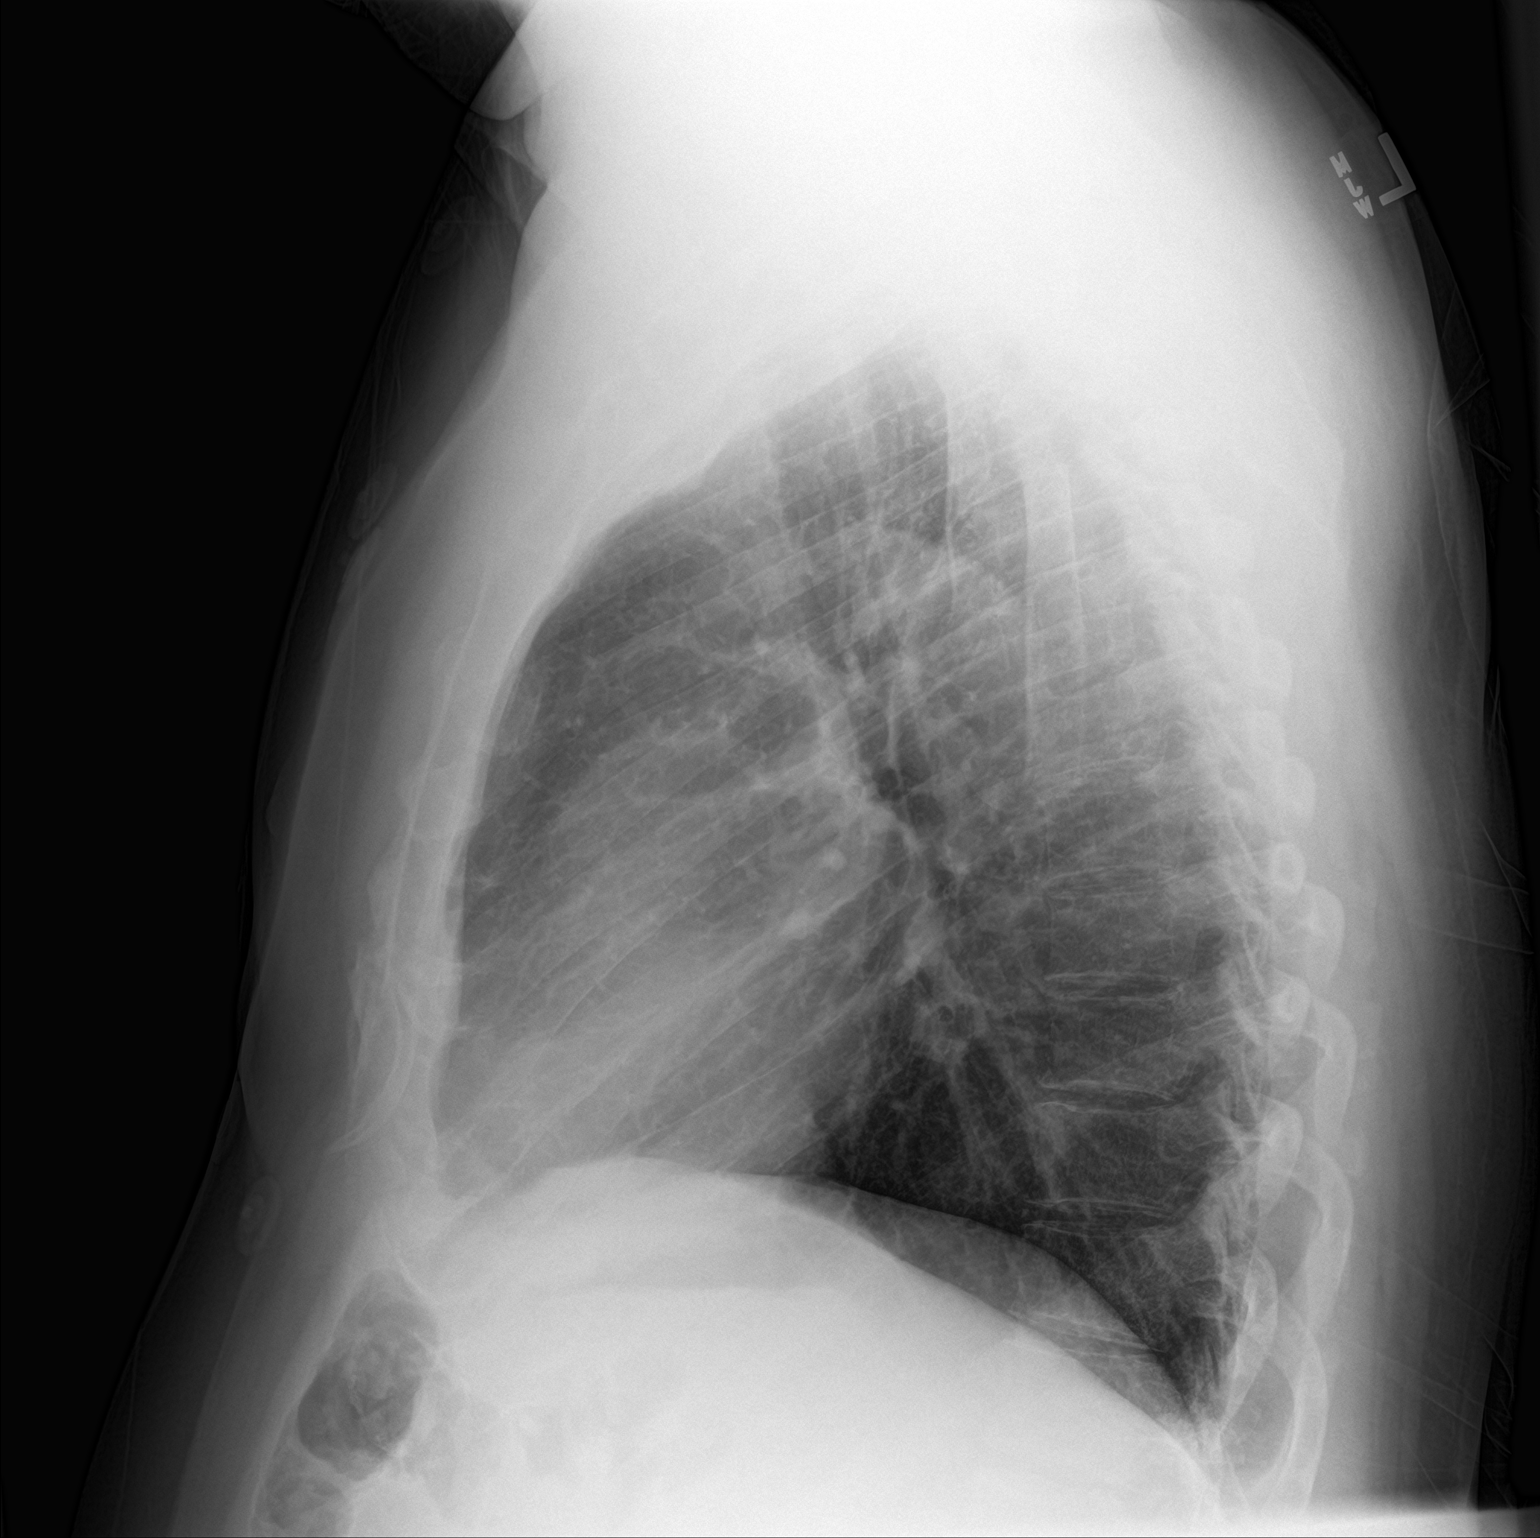

[2 of 2 positions shown; findings below may reference images not displayed]

FINDINGS: Cardiomediastinal silhouette is stable. No acute infiltrate or
pleural effusion. No pulmonary edema. Stable mild basilar
atelectasis or scarring. Bony thorax is unremarkable.
IMPRESSION: No active cardiopulmonary disease. Stable mild basilar atelectasis
or scarring.

## 2017-10-15 IMAGING — CT CT ANGIO CHEST
2 of 9 series · 18 of 46 positions shown · IV contrast (APPLIED)
Comparison: November 05, 2015

CLINICAL DATA: The patient presents with shortness of breath.
Left-sided chest pain this morning. He had a saddle embolus 3 weeks
ago and was discharged on November 10, 2015. The patient is on
anticoagulants.

EXAM:
CT ANGIOGRAPHY CHEST WITH CONTRAST
TECHNIQUE: Multidetector CT imaging of the chest was performed using the
standard protocol during bolus administration of intravenous
contrast. Multiplanar CT image reconstructions and MIPs were
obtained to evaluate the vascular anatomy.
CONTRAST:  100mL OMNIPAQUE IOHEXOL 350 MG/ML SOLN

[Series 5: thins · axial · 0.91mm/px · z∈[-363,-58]mm · 15 of 341 slices shown]
[im 18/341  lung]
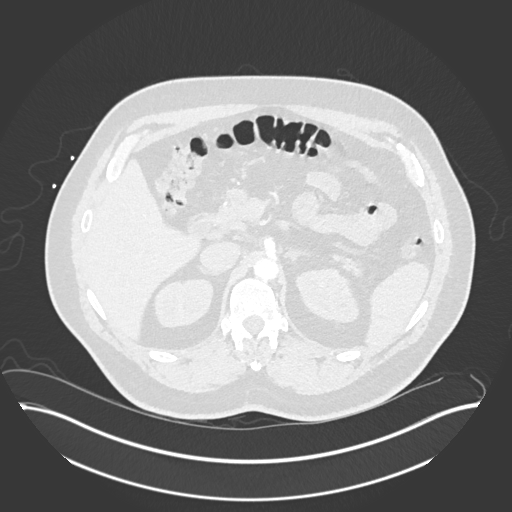
[im 36/341  soft-tissue]
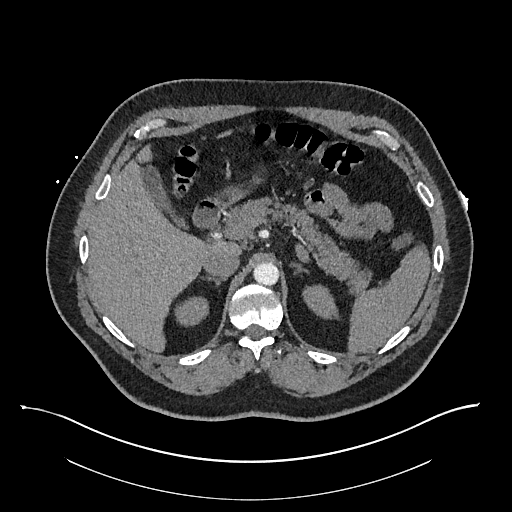
[im 72/341  lung]
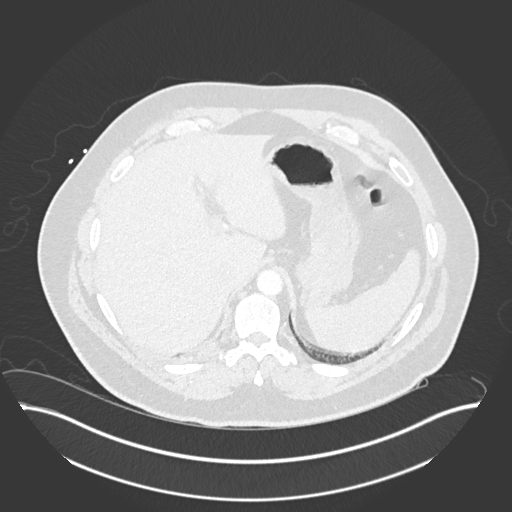
[im 90/341  soft-tissue]
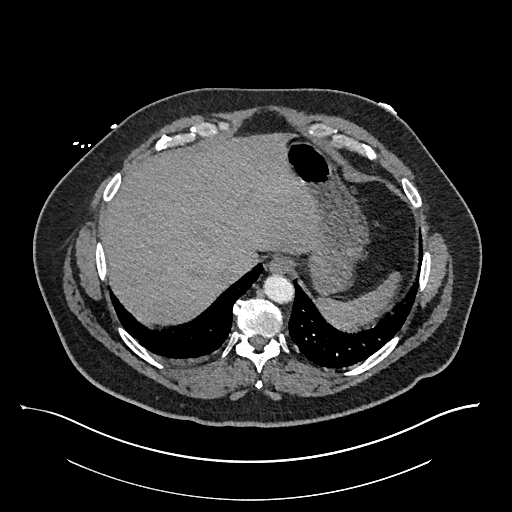
[im 108/341  lung]
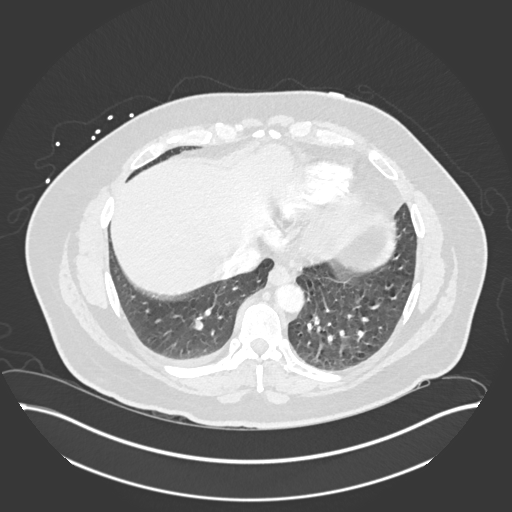
[im 126/341  soft-tissue]
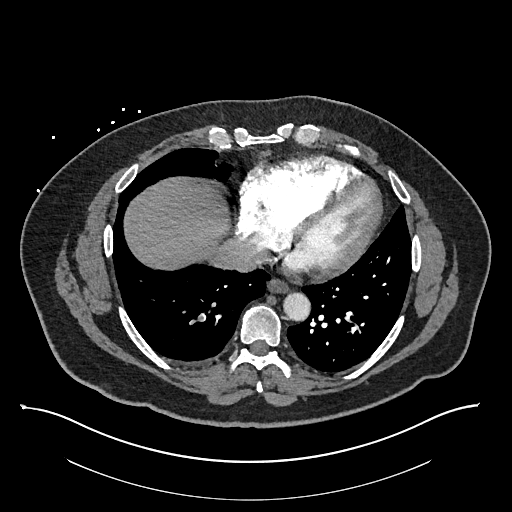
[im 144/341  lung]
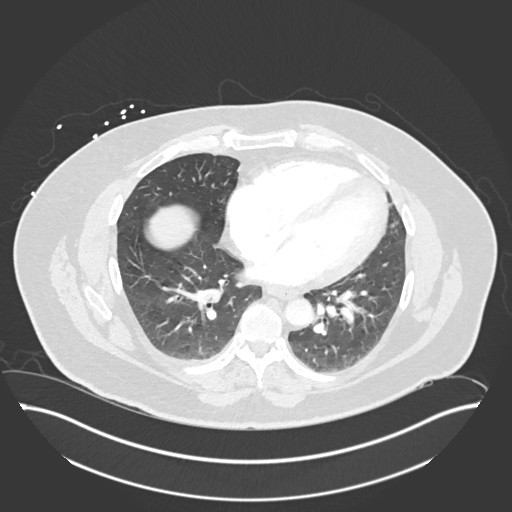
[im 179/341  soft-tissue]
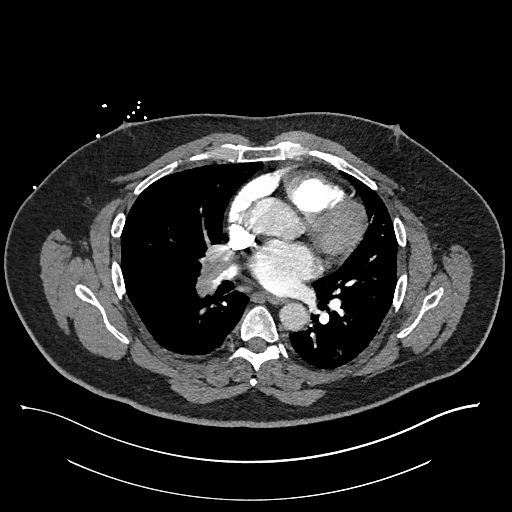
[im 197/341  lung]
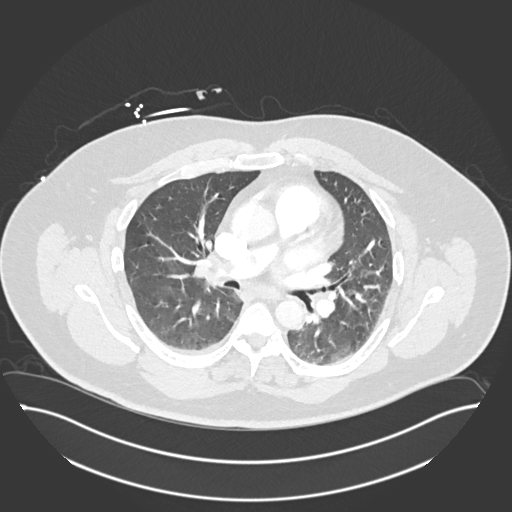
[im 215/341  soft-tissue]
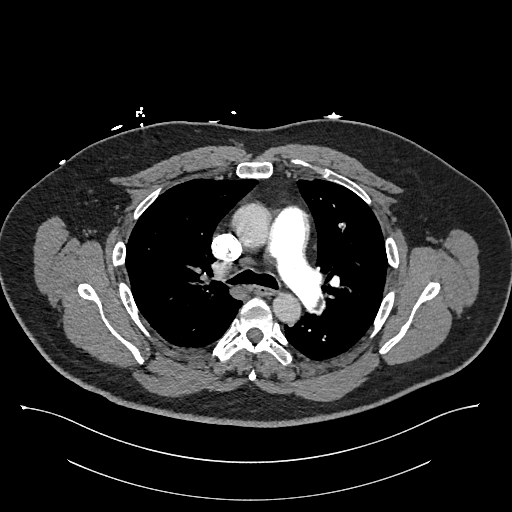
[im 233/341  lung]
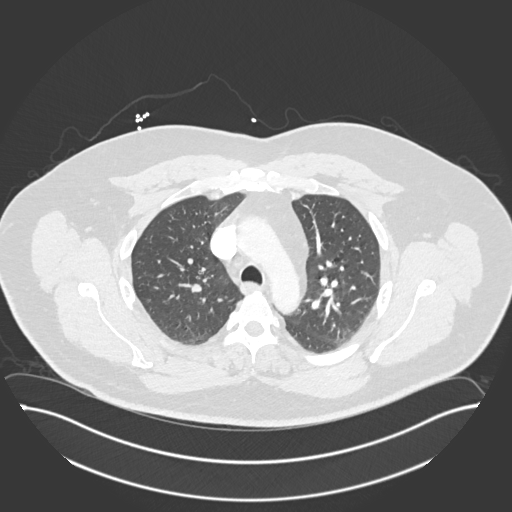
[im 251/341  soft-tissue]
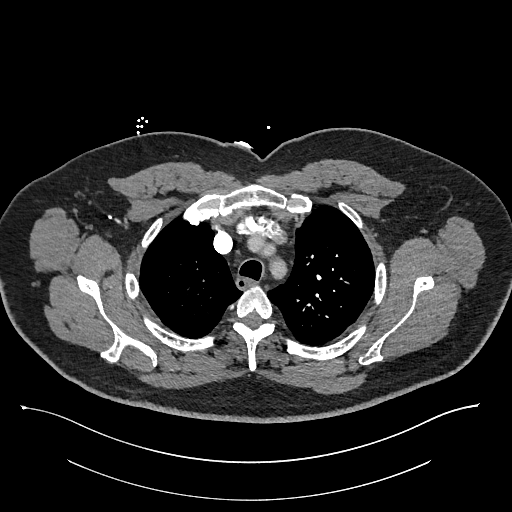
[im 287/341  lung]
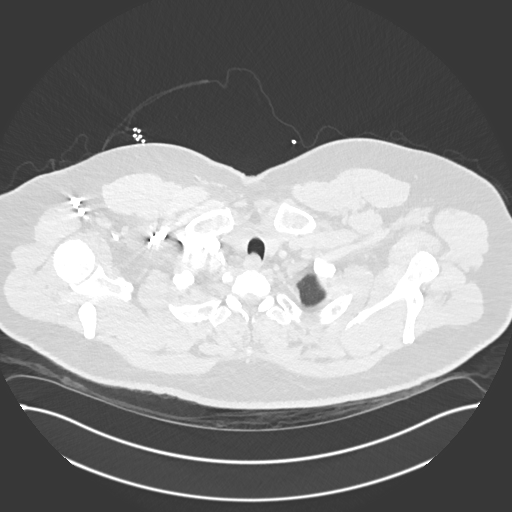
[im 305/341  soft-tissue]
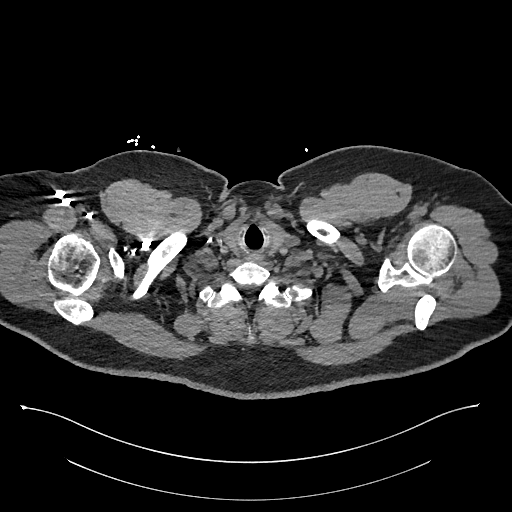
[im 323/341  lung]
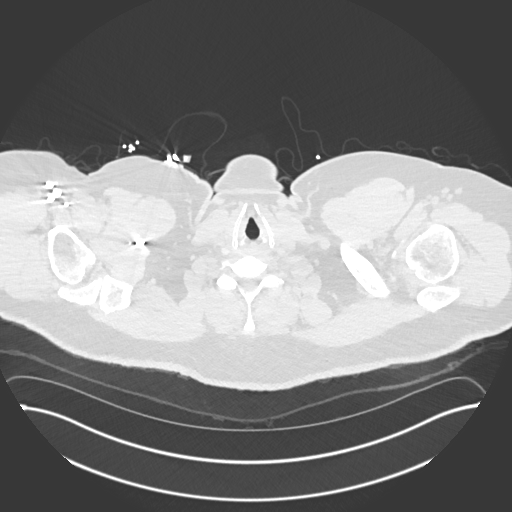

[Series 7: coronal mpr · coronal · 0.64mm/px · 3 of 151 slices shown]
[im 38/151  soft-tissue]
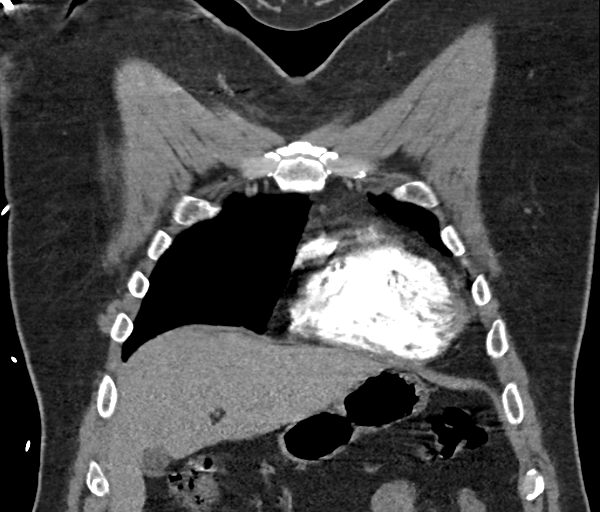
[im 76/151  soft-tissue]
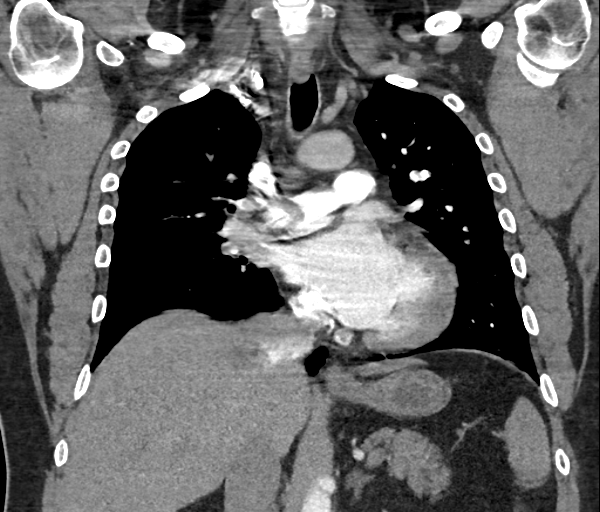
[im 113/151  soft-tissue]
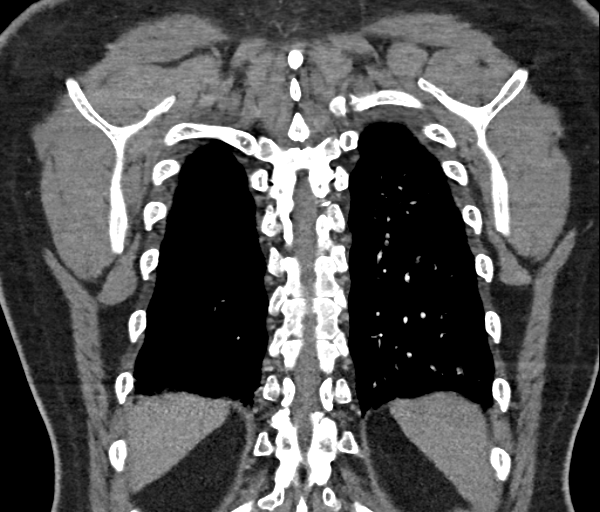

[18 of 46 positions shown; findings below may reference images not displayed]

FINDINGS: Again noted are bilateral pulmonary emboli. The saddle portion of
the embolus seen previously is no longer present in this location.
The embolus burden on the left has significantly improved although
multiple small emboli remain in both the upper and lower lobes on
the left. A significant embolus burden remains on the right with
decreased opacification of more peripheral upper, middle, and lower
lobe branches on this study compared to the previous. I suspect a
portion of the previously-seen saddle embolus has migrated distally
into the right pulmonary artery system. The left ventricular volume
is improved in the interval consistent with decreased right
ventricular strain. The thoracic aorta is poorly opacified but non
aneurysmal. The heart is otherwise unchanged. There is a small right
pleural effusion with no pericardial effusion or left effusion. No
adenopathy.

Central airways are normal. No pneumothorax. No pulmonary infarcts
or suspicious infiltrates identified. No suspicious pulmonary
nodules or masses.

Evaluation of the upper abdomen is limited but unremarkable.

The visualized bones are normal as well.

Review of the MIP images confirms the above findings.
IMPRESSION: 1. The pulmonary embolus burden has decreased on the left and
increased on the right. There is decreased opacification of more
distal right pulmonary arteries today versus the previous study. I
suspect the findings are due to distal migration of the previously
seen saddle embolus preferentially to the right.
Findings called to Dr. Khristya.
